# Patient Record
Sex: Female | Born: 1955 | Race: White | Hispanic: No | Marital: Married | Smoking: Never smoker
Health system: Southern US, Community
[De-identification: ages and names within clinical notes are randomized; demographics above are authoritative.]

## PROBLEM LIST (undated history)

## (undated) DIAGNOSIS — E119 Type 2 diabetes mellitus without complications: Secondary | ICD-10-CM

## (undated) DIAGNOSIS — H40053 Ocular hypertension, bilateral: Secondary | ICD-10-CM

## (undated) DIAGNOSIS — E7800 Pure hypercholesterolemia, unspecified: Secondary | ICD-10-CM

## (undated) DIAGNOSIS — E669 Obesity, unspecified: Secondary | ICD-10-CM

## (undated) DIAGNOSIS — N920 Excessive and frequent menstruation with regular cycle: Secondary | ICD-10-CM

## (undated) DIAGNOSIS — D649 Anemia, unspecified: Secondary | ICD-10-CM

## (undated) DIAGNOSIS — H43811 Vitreous degeneration, right eye: Secondary | ICD-10-CM

## (undated) HISTORY — DX: Pure hypercholesterolemia, unspecified: E78.00

## (undated) HISTORY — DX: Obesity, unspecified: E66.9

## (undated) HISTORY — DX: Vitreous degeneration, right eye: H43.811

## (undated) HISTORY — DX: Anemia, unspecified: D64.9

## (undated) HISTORY — PX: BREAST CYST ASPIRATION: SHX578

## (undated) HISTORY — DX: Excessive and frequent menstruation with regular cycle: N92.0

## (undated) HISTORY — DX: Ocular hypertension, bilateral: H40.053

## (undated) HISTORY — DX: Type 2 diabetes mellitus without complications: E11.9

## (undated) HISTORY — PX: DILATION AND CURETTAGE OF UTERUS: SHX78

---

## 1998-12-20 ENCOUNTER — Other Ambulatory Visit: Admission: RE | Admit: 1998-12-20 | Discharge: 1998-12-20 | Payer: Self-pay | Admitting: Obstetrics and Gynecology

## 1999-07-19 ENCOUNTER — Encounter: Payer: Self-pay | Admitting: Emergency Medicine

## 1999-07-19 ENCOUNTER — Encounter: Admission: RE | Admit: 1999-07-19 | Discharge: 1999-07-19 | Payer: Self-pay | Admitting: Emergency Medicine

## 2000-06-11 ENCOUNTER — Other Ambulatory Visit: Admission: RE | Admit: 2000-06-11 | Discharge: 2000-06-11 | Payer: Self-pay | Admitting: Obstetrics and Gynecology

## 2000-07-19 ENCOUNTER — Encounter: Payer: Self-pay | Admitting: Obstetrics and Gynecology

## 2000-07-19 ENCOUNTER — Encounter: Admission: RE | Admit: 2000-07-19 | Discharge: 2000-07-19 | Payer: Self-pay | Admitting: Obstetrics and Gynecology

## 2001-05-01 ENCOUNTER — Encounter: Payer: Self-pay | Admitting: Obstetrics and Gynecology

## 2001-05-01 ENCOUNTER — Encounter: Admission: RE | Admit: 2001-05-01 | Discharge: 2001-05-01 | Payer: Self-pay | Admitting: Obstetrics and Gynecology

## 2001-06-13 ENCOUNTER — Other Ambulatory Visit: Admission: RE | Admit: 2001-06-13 | Discharge: 2001-06-13 | Payer: Self-pay | Admitting: Obstetrics and Gynecology

## 2001-07-22 ENCOUNTER — Encounter: Admission: RE | Admit: 2001-07-22 | Discharge: 2001-07-22 | Payer: Self-pay | Admitting: Obstetrics and Gynecology

## 2001-07-22 ENCOUNTER — Encounter: Payer: Self-pay | Admitting: Obstetrics and Gynecology

## 2002-07-23 ENCOUNTER — Encounter: Admission: RE | Admit: 2002-07-23 | Discharge: 2002-07-23 | Payer: Self-pay | Admitting: Obstetrics and Gynecology

## 2002-07-23 ENCOUNTER — Encounter: Payer: Self-pay | Admitting: Obstetrics and Gynecology

## 2002-07-30 ENCOUNTER — Other Ambulatory Visit: Admission: RE | Admit: 2002-07-30 | Discharge: 2002-07-30 | Payer: Self-pay | Admitting: Obstetrics and Gynecology

## 2003-07-27 ENCOUNTER — Encounter: Admission: RE | Admit: 2003-07-27 | Discharge: 2003-07-27 | Payer: Self-pay | Admitting: Obstetrics and Gynecology

## 2003-08-10 ENCOUNTER — Other Ambulatory Visit: Admission: RE | Admit: 2003-08-10 | Discharge: 2003-08-10 | Payer: Self-pay | Admitting: Obstetrics and Gynecology

## 2003-12-21 ENCOUNTER — Ambulatory Visit (HOSPITAL_COMMUNITY): Admission: RE | Admit: 2003-12-21 | Discharge: 2003-12-21 | Payer: Self-pay | Admitting: Gastroenterology

## 2004-07-29 ENCOUNTER — Encounter: Admission: RE | Admit: 2004-07-29 | Discharge: 2004-07-29 | Payer: Self-pay | Admitting: Obstetrics and Gynecology

## 2004-08-11 ENCOUNTER — Other Ambulatory Visit: Admission: RE | Admit: 2004-08-11 | Discharge: 2004-08-11 | Payer: Self-pay | Admitting: Obstetrics and Gynecology

## 2005-08-11 DIAGNOSIS — E669 Obesity, unspecified: Secondary | ICD-10-CM

## 2005-08-11 HISTORY — DX: Obesity, unspecified: E66.9

## 2005-08-21 ENCOUNTER — Other Ambulatory Visit: Admission: RE | Admit: 2005-08-21 | Discharge: 2005-08-21 | Payer: Self-pay | Admitting: Obstetrics and Gynecology

## 2005-08-31 ENCOUNTER — Encounter: Admission: RE | Admit: 2005-08-31 | Discharge: 2005-08-31 | Payer: Self-pay | Admitting: Obstetrics and Gynecology

## 2005-09-22 ENCOUNTER — Encounter: Admission: RE | Admit: 2005-09-22 | Discharge: 2005-09-22 | Payer: Self-pay | Admitting: Obstetrics and Gynecology

## 2006-02-20 ENCOUNTER — Encounter: Admission: RE | Admit: 2006-02-20 | Discharge: 2006-02-20 | Payer: Self-pay | Admitting: Obstetrics and Gynecology

## 2006-08-23 ENCOUNTER — Other Ambulatory Visit: Admission: RE | Admit: 2006-08-23 | Discharge: 2006-08-23 | Payer: Self-pay | Admitting: Obstetrics & Gynecology

## 2006-09-03 ENCOUNTER — Encounter: Admission: RE | Admit: 2006-09-03 | Discharge: 2006-09-03 | Payer: Self-pay | Admitting: Obstetrics and Gynecology

## 2007-08-27 ENCOUNTER — Other Ambulatory Visit: Admission: RE | Admit: 2007-08-27 | Discharge: 2007-08-27 | Payer: Self-pay | Admitting: Obstetrics & Gynecology

## 2007-09-17 ENCOUNTER — Encounter: Admission: RE | Admit: 2007-09-17 | Discharge: 2007-09-17 | Payer: Self-pay | Admitting: Obstetrics & Gynecology

## 2008-01-13 ENCOUNTER — Encounter: Admission: RE | Admit: 2008-01-13 | Discharge: 2008-01-13 | Payer: Self-pay | Admitting: Emergency Medicine

## 2008-08-31 ENCOUNTER — Other Ambulatory Visit: Admission: RE | Admit: 2008-08-31 | Discharge: 2008-08-31 | Payer: Self-pay | Admitting: Obstetrics & Gynecology

## 2008-09-22 ENCOUNTER — Encounter: Admission: RE | Admit: 2008-09-22 | Discharge: 2008-09-22 | Payer: Self-pay | Admitting: Obstetrics & Gynecology

## 2008-12-22 ENCOUNTER — Ambulatory Visit (HOSPITAL_COMMUNITY): Admission: RE | Admit: 2008-12-22 | Discharge: 2008-12-23 | Payer: Self-pay | Admitting: Obstetrics & Gynecology

## 2008-12-22 ENCOUNTER — Encounter: Payer: Self-pay | Admitting: Obstetrics & Gynecology

## 2008-12-22 HISTORY — PX: ABDOMINAL HYSTERECTOMY: SHX81

## 2009-09-24 ENCOUNTER — Encounter: Admission: RE | Admit: 2009-09-24 | Discharge: 2009-09-24 | Payer: Self-pay | Admitting: Obstetrics & Gynecology

## 2009-12-30 ENCOUNTER — Encounter: Admission: RE | Admit: 2009-12-30 | Discharge: 2009-12-30 | Payer: Self-pay | Admitting: Family Medicine

## 2010-02-09 ENCOUNTER — Ambulatory Visit (HOSPITAL_COMMUNITY): Admission: RE | Admit: 2010-02-09 | Discharge: 2010-02-09 | Payer: Self-pay | Admitting: Surgery

## 2010-02-09 HISTORY — PX: OTHER SURGICAL HISTORY: SHX169

## 2010-10-02 ENCOUNTER — Encounter: Payer: Self-pay | Admitting: Obstetrics and Gynecology

## 2010-10-11 ENCOUNTER — Other Ambulatory Visit: Payer: Self-pay | Admitting: Obstetrics & Gynecology

## 2010-10-11 DIAGNOSIS — Z1239 Encounter for other screening for malignant neoplasm of breast: Secondary | ICD-10-CM

## 2010-11-28 LAB — DIFFERENTIAL
Basophils Relative: 0 % (ref 0–1)
Eosinophils Absolute: 0.1 10*3/uL (ref 0.0–0.7)
Monocytes Relative: 9 % (ref 3–12)
Neutrophils Relative %: 52 % (ref 43–77)

## 2010-11-28 LAB — COMPREHENSIVE METABOLIC PANEL
ALT: 22 U/L (ref 0–35)
Alkaline Phosphatase: 86 U/L (ref 39–117)
CO2: 29 mEq/L (ref 19–32)
Chloride: 105 mEq/L (ref 96–112)
GFR calc non Af Amer: >60 mL/min (ref >60)
Glucose, Bld: 95 mg/dL (ref 70–99)
Potassium: 3.8 mEq/L (ref 3.5–5.1)
Sodium: 142 mEq/L (ref 135–145)
Total Bilirubin: 0.5 mg/dL (ref 0.3–1.2)
Total Protein: 7.1 g/dL (ref 6.0–8.3)

## 2010-11-28 LAB — CBC
Hemoglobin: 13 g/dL (ref 12.0–15.0)
RBC: 4.28 MIL/uL (ref 3.87–5.11)
WBC: 5.9 10*3/uL (ref 4.0–10.5)

## 2010-12-05 ENCOUNTER — Ambulatory Visit
Admission: RE | Admit: 2010-12-05 | Discharge: 2010-12-05 | Disposition: A | Discharge status: Home / Self Care | Payer: BC Managed Care – PPO | Source: Ambulatory Visit | Attending: Obstetrics & Gynecology | Admitting: Obstetrics & Gynecology

## 2010-12-05 DIAGNOSIS — Z1239 Encounter for other screening for malignant neoplasm of breast: Secondary | ICD-10-CM

## 2010-12-21 LAB — BASIC METABOLIC PANEL
CO2: 29 mEq/L (ref 19–32)
Calcium: 9.3 mg/dL (ref 8.4–10.5)
GFR calc Af Amer: >60 mL/min (ref >60)
GFR calc non Af Amer: >60 mL/min (ref >60)
Glucose, Bld: 115 mg/dL — ABNORMAL HIGH (ref 70–99)
Glucose, Bld: 136 mg/dL — ABNORMAL HIGH (ref 70–99)
Potassium: 3.7 mEq/L (ref 3.5–5.1)
Sodium: 139 mEq/L (ref 135–145)
Sodium: 140 mEq/L (ref 135–145)

## 2010-12-21 LAB — CBC
HCT: 32 % — ABNORMAL LOW (ref 36.0–46.0)
Hemoglobin: 10.9 g/dL — ABNORMAL LOW (ref 12.0–15.0)
Hemoglobin: 12.7 g/dL (ref 12.0–15.0)
MCHC: 34 g/dL (ref 30.0–36.0)
Platelets: 336 10*3/uL (ref 150–400)
RBC: 3.71 MIL/uL — ABNORMAL LOW (ref 3.87–5.11)
RDW: 14.7 % (ref 11.5–15.5)
RDW: 14.8 % (ref 11.5–15.5)

## 2010-12-21 LAB — URINALYSIS, ROUTINE W REFLEX MICROSCOPIC
Bilirubin Urine: NEGATIVE
Glucose, UA: NEGATIVE mg/dL
Hgb urine dipstick: NEGATIVE
Specific Gravity, Urine: 1.025 (ref 1.005–1.030)
pH: 5 (ref 5.0–8.0)

## 2010-12-21 LAB — PROTIME-INR: INR: 1 (ref 0.00–1.49)

## 2010-12-21 LAB — TYPE AND SCREEN

## 2010-12-21 LAB — APTT: aPTT: 29 seconds (ref 24–37)

## 2011-01-24 NOTE — Op Note (Signed)
Carolyn Rollins, Carolyn Rollins             ACCOUNT NO.:  [id number]   MEDICAL RECORD NO.:  [id number]          PATIENT TYPE:  OIB   LOCATION:  9320                          FACILITY:  [facility]   PHYSICIAN:  M. Leda Quail, MD  DATE OF BIRTH:  January 06, 1956   DATE OF PROCEDURE:  12/22/2008  DATE OF DISCHARGE:                               OPERATIVE REPORT   PREOPERATIVE DIAGNOSES:  A 55 year old G3, P2, A1, married white female  with menorrhagia, pelvic pain, and fibroid uterus.   POSTOPERATIVE DIAGNOSES:  A 55 year old G3, P2, A1, married white female  with menorrhagia, pelvic pain, and fibroid uterus.   PROCEDURE:  Laparoscopic-assisted vaginal hysterectomy and bilateral  salpingo-oophorectomy.   SURGEON:  M. Leda Quail, MD   ASSISTANT:  Edwena Felty. Romine, MD   ANESTHESIA:  General endotracheal, Raul Del, MD, oversaw the  procedure.   FINDINGS:  Fibroid uterus with normal-appearing ovaries and tubes.   SPECIMENS:  Uterus, cervix, tubes, and ovaries all sent to Pathology.   ESTIMATED BLOOD LOSS:  100 mL.   URINE OUTPUT:  600 mL of clear urine through Foley catheter.   FLUIDS:  2300 mL of LR.   COMPLICATIONS:  None.   INDICATIONS:  Ms. Knoop is a 55 year old G3, P2, A1 married white  female with a history of a fibroid uterus with menorrhagia and pelvic  pain that has worsened over the last 3 years.  She has been evaluated by  Dr. Loreta Ave in GI for possible gastroenterologic causes of pain and her  evaluation has been essentially normal.  Her pain is significantly worse  right before and during menstrual cycles.  We talked about possible  diagnosis of endometriosis.  Her ultrasound does have some bodies  consistent with adenomyosis, but not 100% definitive, as the patient  cycles continued to worsen or since she has decided at this point to  proceed with definitive management.  Risks and benefits have been  discussed with her.  These are all documented in her office  chart.   PROCEDURE:  The patient was taken to the operating room.  She had a  running IV in place.  Informed consent was present on the chart.  She  was placed in supine position initially.  General endotracheal  anesthesia was administered by the anesthesia staff without difficulty.  Legs were positioned in the low-lithotomy position in the [place]  stirrups.  Some slight bend in her knee is present.  SCDs were present  on the lower extremities bilaterally.  Abdomen, perineum, inner thighs,  and vagina were prepped in normal sterile fashion with Betadine.  A  bivalve speculum was placed in the vagina.  The anterior lip of the  cervix was grasped with a single-tooth tenaculum.  A Hulka clamped was  passed through the cervical canal attached to the cervix as a means to  manipulate the uterus for the procedure.  The tenaculum was removed and  the Hulka clamp was left in place.  The speculum was removed from the  vagina.  Foley catheter was inserted under sterile conditions in the  bladder.  The patient was  then draped in normal sterile fashion.  Legs  were left in the low-lithotomy position.  Attention was turned to the  abdomen.  0.25% Marcaine was placed beneath the umbilicus.  Using a #11  blade, a  10-mm skin incision was made.  The subcu fat tissue was  dissected.  The Veress needle was obtained.  The abdomen was elevated  and the Veress needle was aimed toward the pelvis.  Once the peritoneum  was popped through, a syringe of normal saline was obtained.  This was  attached to the Veress needle.  Aspiration was initially performed  without any blood or fluid being noted.  Fluid was injected into the  Veress needle.  Then, an aspiration was performed again.  No fluid,  blood, or saline was noted.  Saline dripped easily into the Veress  needle.  CO2 gas was then attached to the Veress needle and  pneumoperitoneum was achieved under low flows.  Once 3 liters of CO2 gas  was in the abdomen,  the Veress needle was removed.  A 10-mm OptiVu  trocar and port were obtained and attached to the laparoscope.  The  abdominal wall was elevated, and using a twisting motion in the trocar  and port, the laparoscope was placed into the abdomen.  Intraperitoneal  placement was noted and confirmed.  The patient was placed in semi-  Trendelenburg positioning.  The pelvis and upper abdomen were surveyed.  Photodocumentation was made.  Ovaries appeared normal.  Uterus appeared  normal other than being globular and consistent with fibroids.  The  upper abdomen appears normal with a normal liver edge and stomach edge.  There was no evidence of endometriosis or adhesions.  Ureter peristalsis  was noted bilaterally.  The abdominal wall layers transilluminated and  port site locations chosen in the right and left lower quadrant.  0.25%  Marcaine was used to anesthetize the skin.  A #11 blade was used to make  a skin incision.  Then, the 5-mm nonbladed trocar and ports were placed  under direct visualization in the right and left lower quadrants.  Blunt  probe was used to help manipulate the ovaries.  They are freely mobile.  An EnSeal device was obtained.  Starting with the right IP ligament, the  IP ligament was serially clamped, cauterized, and transected using the  EnSeal device.  This continued down the mesosalpinx into the right round  ligament which was serially clamped, cauterized, and incised.  Then, the  inferior portion of the broad ligament was serially clamped, cauterized,  and incised.  Then, in similar fashion on the left side starting with  the IP ligament was serially clamped, cauterized, and incised.  The left  round ligament serially clamped, cauterized, and incised and the  inferior portion of the broad ligament was serially clamped, cauterized,  and incised.  Excellent hemostasis was present at this point.  All  pedicles were well above the level of the ureters.  At this point,   decision was made to move to the vaginal portion of the procedure.  All  instruments were removed from the vagina.  The pneumoperitoneum was  released.  The patient was taken out of Trendelenburg.  Legs were lifted  to the high-lithotomy position.  The attention was turned to vagina.  Heavy-weighted speculum was placed in the vagina.  The patient was then  placed in a tiny bit of Trendelenburg.  Cervix was grasped with a  Jacobson tenaculum.  1% lidocaine mixed 1:1  with epinephrine (1:100,000  units) was instilled around the cervix.  Posterior cul-de-sac was  entered sharply.  The posterior vaginal mucosa and peritoneum were  attached using figure-of-eight suture of #0 Vicryl.  Then, a Bonanno  speculum was placed in this incision.  The uterosacral ligaments on each  side are clamped, incised, and suture ligated with Heaney stitches of #0  Vicryl.  Then, the anterior aspect of the cervix was incised with a  blade.  The pubovesicocervical fascia was then dissected sharply with  scissors down to the level of the cervix.  Then using blunt dissection,  the plane between the bladder and the cervix was identified and the  bladder was dissected off of the uterus.  The reflection of the  peritoneum was noted as quite high.  Decision was made to take some  bites of the cardinal ligament at this point.  Cardinal ligaments were  then serially clamped, cut, and suture ligated with #0 Vicryl.  This was  done twice on each side.  At this point, the reflection of the  peritoneum anteriorly could be very easily visualized.  This was  elevated and incised sharply.  It was opened sharply.  A Deaver  retractor was placed through this incision.  Bowel was noted above the  fundus of the uterus to ensure correct plane location.  Then, the  remaining portion of the cardinal ligaments were serially clamped,  transected, and suture ligated with #0 Vicryl.  Then, the uterine artery  on each side was identified,  clamped, and incised.  The uterine artery  pedicle was doubly suture ligated with #0 Vicryl x2 on each side.  Then,  1 additional clamp of the broad ligament on each side was obtained.  This was transected and suture ligated with #0 Vicryl.  At this point,  there was minimal tissue that was left.  The uterus could be delivered  through the incision.  A curved Heaney clamp was placed across this  small remaining pedicles of tissue.  These tissue pedicles were then  tied with free ties of #0 Vicryl.  At this point, the uterus, cervix,  tubes, and ovaries have been delivered from the vagina.  The operative  weight was 115 grams.  The pedicles were hemostatic, therefore the  anterior peritoneum and vaginal mucosa were connected with the running  interlocking fashion.  This was using #0 Vicryl.  The cuff was run  completely by starting at the 2 o'clock position incorporating the  uterosacral ligaments and posterior vaginal mucosa and posterior  peritoneum were appropriate.  Running the cuff ended at 10 o'clock and  the suture was tied.  Then, 3 figure-of-eight sutures of #0 Vicryl used  to close the cuff completely.  The vaginal tissue was irrigated.  No  bleeding was noted.  Legs positioned back in the low-lithotomy position  after all instruments were removed from the vagina.  Sterile gloves were  changed.  Attention was turned to the abdomen.  Pneumoperitoneum was  reachieved under low flow.  The laparoscope was used to visualize the  pelvis.  The patient was placed in semi-Trendelenburg.  A Nezhat suction  irrigator was obtained.  Pedicles were irrigated.  There was a tiny bit  of bleeding on the edge of the left cuff which was grasped with an  EnSeal device and made hemostatic.  Ureter peristalsis was noted  bilaterally again.  At this point, excellent hemostasis was noted and  the procedure was ended.  The pneumoperitoneum  was relieved.  The  patient was placed back and was taken off  Trendelenburg positioning.  The 2 inferior ports were removed under direct visualization of  laparoscope.  Then, before the midline port was completely removed, the  patient was given several deep breaths by the CRNA to try and get as  much gas off the abdomen as possible.  The midline port was then  removed.  The fascia was closed at the midline using a figure-of-eight  suture of #2-0 Vicryl.  The skin at the umbilicus was closed using a  subcuticular stitch of 3-0 Vicryl.  Skin was cleansed and dried.  Dermabond was used to close the incisions inferiorly and make the  midline incision air tight.   Sponge, lap, needle and instrument counts were correct x2.  The patient  tolerated procedure very well.  The patient was awakened from anesthesia  and extubated without difficulty.  The Betadine scrub was washed out of  the patient's skin.  The legs were positioned back in the supine  position.  She was then taken to the recovery room in stable condition.      Lum Keas, MD  Electronically Signed     MSM/MEDQ  D:  12/22/2008  T:  12/23/2008  Job:  [id number]   cc:   Anselmo Rod, M.D.  Fax: [phone]

## 2011-01-24 NOTE — Discharge Summary (Signed)
Carolyn Rollins, Carolyn Rollins             ACCOUNT NO.:  [id number]   MEDICAL RECORD NO.:  [id number]          PATIENT TYPE:  OIB   LOCATION:  9320                          FACILITY:  [facility]   PHYSICIAN:  M. Leda Quail, MD  DATE OF BIRTH:  16-Aug-1956   DATE OF ADMISSION:  12/22/2008  DATE OF DISCHARGE:                               DISCHARGE SUMMARY   ADMISSION DIAGNOSES:  7. A 55 year old G3, P2, A1, married white female with menorrhagia.  2. Pelvic pain.  3. Fibroids.   DISCHARGE DIAGNOSES:  4. A 55 year old G3, P2, A1, married white female with menorrhagia.  2. Pelvic pain.  3. Fibroids.   PROCEDURE:  laparoscopic-assisted vaginal hysterectomy and bilateral  salpingo-oophorectomy.   HISTORY OF PRESENT ILLNESS:  Written in the chart.   HOSPITAL COURSE:  The patient was admitted to Same-Day Surgery.  She was  taken to operating room where an LAVH-BSO was performed out difficulty.  She had 100 mL blood loss.  The patient was stable during the surgery.  After the surgery was over, she was transferred to the recovery room and  from there to the third floor for the remainder of her hospitalization.  She used more Dilaudid low-dose PCA for pain management and had a Foley  overnight.  She was seen in the evening of postoperative day zero, was  doing very well, was tolerating liquids, was able to stand and  ambulating about her room without difficulty.  She had excellent bowel  sounds and good pain management.  By postoperative day #1, she had  passed flatus.  She had no nausea and good pain control.  Vital signs  were stable.  She was afebrile.  She made 1450 mL of urine output.  Postoperative labs showed a hemoglobin of 10.9, white count of 12.2,  platelet count of 290.  BUN and creatinine were 6 and 0.6.  Sodium and  potassium were 139 and 3.7 respectively.  Exam was completely benign.  Her abdomen was soft, nontender, nondistended with good bowel sounds.  Incisions were clean, dry,  and intact.  She had minimal spotting  vaginally.  At this point, she had been able to ambulate.  Her Foley  already had been removed and has been able to void.  She had tolerate  regular foods.  Her IV and PCA will be discontinued this morning and she  will be transitioned to oral pain medicines.  The patient is well and  she will be discharged to home with her family.  The patient will see me  next week for followup on hopefully Wednesday.  This appointment will be  made later today.  She has prescriptions for Percocet and Motrin to be  taken postoperatively for pain.  Directions have been provided in the  verbal and written form for her.  She is also going to start Vivelle-Dot  patches and her husband will come by the office in the morning to pick  up the samples of this.     Lum Keas, MD  Electronically Signed    MSM/MEDQ  D:  12/23/2008  T:  12/23/2008  Job:  [id number]

## 2011-01-27 NOTE — Op Note (Signed)
NAME:  Carolyn Rollins, Carolyn Rollins                       ACCOUNT NO.:  [id number]   MEDICAL RECORD NO.:  [id number]                   PATIENT TYPE:  AMB   LOCATION:  ENDO                                 FACILITY:  [facility]   PHYSICIAN:  John C. Madilyn Fireman, M.D.                 DATE OF BIRTH:  1955/11/20   DATE OF PROCEDURE:  12/21/2003  DATE OF DISCHARGE:                                 OPERATIVE REPORT   PROCEDURE:  Colonoscopy.   INDICATION FOR PROCEDURE:  Family history of colon cancer in a second degree  relative in a patient with no recent colon screening.   PROCEDURE:  The patient was placed in the left lateral decubitus position  and placed on the pulse monitor with continuous low-flow oxygen delivered by  nasal cannula.  She was sedated with 75 mcg IV fentanyl and 8 mg IV Versed.  The Olympus video colonoscope was inserted into the rectum and advanced to  the cecum, confirmed by transillumination of McBurney's point and  visualization of ileocecal valve and appendiceal orifice.  The prep was  excellent.  The cecum, ascending, transverse, descending and sigmoid colon  all appeared normal.  No masses, polyps, diverticula or other mucosal  abnormality.  The rectum likewise appeared normal.  On retroflexed view, the  anus revealed only small internal hemorrhoids.  The scope was then withdrawn  and the patient returned to the recovery room in stable condition.  She  tolerated the procedure well and there were no immediate complications.   IMPRESSION:  Internal hemorrhoids, otherwise normal study.   PLAN:  Will probably proceed with next colon screening by colonoscopy in  approximately __________ time.                                               John C. Madilyn Fireman, M.D.    JCH/MEDQ  D:  12/21/2003  T:  12/21/2003  Job:  [id number]   cc:   Oley Balm. Georgina Pillion, M.D.  [address]  [place]  [place] [address]  Fax: [phone]

## 2011-09-12 DIAGNOSIS — E119 Type 2 diabetes mellitus without complications: Secondary | ICD-10-CM

## 2011-09-12 HISTORY — DX: Type 2 diabetes mellitus without complications: E11.9

## 2011-10-19 ENCOUNTER — Other Ambulatory Visit: Payer: Self-pay | Admitting: Obstetrics & Gynecology

## 2011-10-19 DIAGNOSIS — Z1231 Encounter for screening mammogram for malignant neoplasm of breast: Secondary | ICD-10-CM

## 2011-12-06 ENCOUNTER — Ambulatory Visit
Admission: RE | Admit: 2011-12-06 | Discharge: 2011-12-06 | Disposition: A | Discharge status: Home / Self Care | Payer: BC Managed Care – PPO | Source: Ambulatory Visit | Attending: Obstetrics & Gynecology | Admitting: Obstetrics & Gynecology

## 2011-12-06 DIAGNOSIS — Z1231 Encounter for screening mammogram for malignant neoplasm of breast: Secondary | ICD-10-CM

## 2012-07-10 ENCOUNTER — Other Ambulatory Visit: Payer: Self-pay | Admitting: Obstetrics & Gynecology

## 2012-07-10 DIAGNOSIS — Z780 Asymptomatic menopausal state: Secondary | ICD-10-CM

## 2012-07-12 ENCOUNTER — Ambulatory Visit
Admission: RE | Admit: 2012-07-12 | Discharge: 2012-07-12 | Disposition: A | Discharge status: Home / Self Care | Payer: BC Managed Care – PPO | Source: Ambulatory Visit | Attending: Obstetrics & Gynecology | Admitting: Obstetrics & Gynecology

## 2012-07-12 DIAGNOSIS — Z780 Asymptomatic menopausal state: Secondary | ICD-10-CM

## 2012-09-23 ENCOUNTER — Other Ambulatory Visit: Payer: Self-pay | Admitting: Obstetrics & Gynecology

## 2012-09-23 DIAGNOSIS — Z1231 Encounter for screening mammogram for malignant neoplasm of breast: Secondary | ICD-10-CM

## 2013-01-02 ENCOUNTER — Ambulatory Visit
Admission: RE | Admit: 2013-01-02 | Discharge: 2013-01-02 | Disposition: A | Discharge status: Home / Self Care | Payer: BC Managed Care – PPO | Source: Ambulatory Visit | Attending: Obstetrics & Gynecology | Admitting: Obstetrics & Gynecology

## 2013-01-02 DIAGNOSIS — Z1231 Encounter for screening mammogram for malignant neoplasm of breast: Secondary | ICD-10-CM

## 2013-06-03 ENCOUNTER — Encounter: Payer: Self-pay | Admitting: Obstetrics & Gynecology

## 2013-08-20 ENCOUNTER — Encounter: Payer: Self-pay | Admitting: Obstetrics & Gynecology

## 2013-08-21 ENCOUNTER — Ambulatory Visit (INDEPENDENT_AMBULATORY_CARE_PROVIDER_SITE_OTHER): Payer: No Typology Code available for payment source | Admitting: Obstetrics & Gynecology

## 2013-08-21 ENCOUNTER — Encounter: Payer: Self-pay | Admitting: Obstetrics & Gynecology

## 2013-08-21 VITALS — BP 120/82 | HR 60 | Resp 16 | Ht 64.0 in | Wt 190.8 lb

## 2013-08-21 DIAGNOSIS — Z01419 Encounter for gynecological examination (general) (routine) without abnormal findings: Secondary | ICD-10-CM

## 2013-08-21 DIAGNOSIS — Z0000 Encounter for general adult medical examination without abnormal findings: Secondary | ICD-10-CM

## 2013-08-21 LAB — POCT URINALYSIS DIPSTICK
Blood, UA: NEGATIVE
Glucose, UA: NEGATIVE
Ketones, UA: NEGATIVE
Urobilinogen, UA: NEGATIVE

## 2013-08-21 LAB — HEMOGLOBIN, FINGERSTICK: Hemoglobin, fingerstick: 12.9 g/dL (ref 12.0–16.0)

## 2013-08-21 MED ORDER — ESTRADIOL 0.1 MG/24HR TD PTTW: 1.0000 | MEDICATED_PATCH | TRANSDERMAL | Status: DC

## 2013-08-21 NOTE — Patient Instructions (Signed)

## 2013-08-21 NOTE — Progress Notes (Signed)
57 y.o. G3P2 MarriedCaucasianF here for annual exam.  Daughter getting married Saturday.  This is, of course, a very busy week for her.  Sisters are coming in to help today.  Son is coming in from [place].  He is working on his dissertation for his PhD in [employer].    She does still have some hot flashes and she will occasionally have some vaginal dryness.    Patient's last menstrual period was 09/11/2008.          Sexually active: yes  The current method of family planning is status post hysterectomy.    Exercising: yes  walking some Smoker:  no  Health Maintenance: Pap:  08/31/08 WNL History of abnormal Pap:  no MMG:  01/02/13 3D normal Colonoscopy:  11/13 Dr Marylee Floras in 5 years BMD:   11/13, normal TDaP:  2007 Screening Labs: Dr. Kevan Ny, Hb today: 12.9, Urine today: WBC-trace, PROTEIN-trace   reports that she has never smoked. She has never used smokeless tobacco. She reports that she drinks about 0.5 ounces of alcohol per week. She reports that she does not use illicit drugs.  Past Medical History  Diagnosis Date  . Elevated LDL cholesterol level   . Menorrhagia   . Obesity 12/06  . Diabetes 2013    AODM- Started on Metformin    Past Surgical History  Procedure Laterality Date  . Abdominal hysterectomy  12/22/08    LAVH/BSO  . Dilation and curettage of uterus    . Lipoma removal  02/2010    Current Outpatient Prescriptions  Medication Sig Dispense Refill  . estradiol (VIVELLE-DOT) 0.1 MG/24HR patch Place 1 patch onto the skin 2 (two) times a week.      . latanoprost (XALATAN) 0.005 % ophthalmic solution       . metFORMIN (GLUCOPHAGE-XR) 500 MG 24 hr tablet Take 500 mg by mouth daily with breakfast.      . ONE TOUCH ULTRA TEST test strip       . Vitamin D, Ergocalciferol, (DRISDOL) 50000 UNITS CAPS capsule Take 50,000 Units by mouth every 7 (seven) days.       . fluticasone (FLONASE) 50 MCG/ACT nasal spray        No current facility-administered medications for this  visit.    Family History  Problem Relation Age of Onset  . Asthma Mother   . Emphysema Father   . Cancer Sister     bladder cancer  . Emphysema Sister   . Cancer Brother     lung cancer    ROS:  Pertinent items are noted in HPI.  Otherwise, a comprehensive ROS was negative.  Exam:   BP 120/82  Pulse 60  Resp 16  Ht 5\' 4"  (1.626 m)  Wt 190 lb 12.8 oz (86.546 kg)  BMI 32.73 kg/m2  LMP 09/11/2008  Weight change: -14lbs   Height: 5\' 4"  (162.6 cm)  Ht Readings from Last 3 Encounters:  08/21/13 5\' 4"  (1.626 m)    General appearance: alert, cooperative and appears stated age Head: Normocephalic, without obvious abnormality, atraumatic Neck: no adenopathy, supple, symmetrical, trachea midline and thyroid normal to inspection and palpation Lungs: clear to auscultation bilaterally Breasts: normal appearance, no masses or tenderness Heart: regular rate and rhythm Abdomen: soft, non-tender; bowel sounds normal; no masses,  no organomegaly Extremities: extremities normal, atraumatic, no cyanosis or edema Skin: Skin color, texture, turgor normal. No rashes or lesions Lymph nodes: Cervical, supraclavicular, and axillary nodes normal. No abnormal inguinal nodes palpated Neurologic: Grossly  normal   Pelvic: External genitalia:  no lesions              Urethra:  normal appearing urethra with no masses, tenderness or lesions              Bartholins and Skenes: normal                 Vagina: normal appearing vagina with normal color and discharge, no lesions              Cervix: absent              Pap taken: no Bimanual Exam:  Uterus:  normal size, contour, position, consistency, mobility, non-tender              Adnexa: normal adnexa and no mass, fullness, tenderness               Rectovaginal: Confirms               Anus:  normal sphincter tone, no lesions  A:  Well Woman with normal exam H/O LAVH/BSO4/10, on HRT Low Vitamin D IBS  P:   Mammogram yearly.  We discussed 3D MMG  and her breast density. pap smear not indicated Vit D today.  On Vit D 50,000 IU weekly.  Will need new RX Change to Minivelle dot 0.1mg  patches weekly.  Changed due to cost. return annually or prn  An After Visit Summary was printed and given to the patient.

## 2013-08-22 LAB — VITAMIN D 25 HYDROXY (VIT D DEFICIENCY, FRACTURES): Vit D, 25-Hydroxy: 42 ng/mL (ref 30–89)

## 2013-08-22 MED ORDER — VITAMIN D (ERGOCALCIFEROL) 1.25 MG (50000 UNIT) PO CAPS: 50000.0000 [IU] | ORAL_CAPSULE | ORAL | Status: DC

## 2013-08-22 NOTE — Addendum Note (Signed)
Addended by: Jerene Bears on: 08/22/2013 05:24 AM   Modules accepted: Orders

## 2013-09-30 ENCOUNTER — Telehealth: Payer: Self-pay | Admitting: Obstetrics & Gynecology

## 2013-09-30 NOTE — Telephone Encounter (Signed)
Message left to return call to Carolyn Rollins at [phone].    

## 2013-09-30 NOTE — Telephone Encounter (Signed)
Pt states she has a question for the nurse regarding her medication.

## 2013-10-22 ENCOUNTER — Other Ambulatory Visit: Payer: Self-pay | Admitting: Obstetrics & Gynecology

## 2013-10-22 MED ORDER — ESTRADIOL 1 MG PO TABS: 1.0000 mg | ORAL_TABLET | Freq: Every day | ORAL | Status: DC

## 2013-10-22 NOTE — Progress Notes (Signed)
Patient needs new HRT recommendation due to change in coverage.  For now, will change from minivelle 0.1mg  to oral estradiol 1mg  daily.  Rx to pharmacy.  Left message for patient.  If you have earlier phone encounter open, ok to close.

## 2013-11-27 ENCOUNTER — Ambulatory Visit: Payer: Self-pay | Admitting: Obstetrics & Gynecology

## 2013-11-27 ENCOUNTER — Ambulatory Visit: Payer: No Typology Code available for payment source | Admitting: Obstetrics & Gynecology

## 2013-12-09 ENCOUNTER — Other Ambulatory Visit: Payer: Self-pay

## 2013-12-09 DIAGNOSIS — Z1231 Encounter for screening mammogram for malignant neoplasm of breast: Secondary | ICD-10-CM

## 2014-01-01 ENCOUNTER — Other Ambulatory Visit: Payer: Self-pay | Admitting: Dermatology

## 2014-01-05 ENCOUNTER — Ambulatory Visit
Admission: RE | Admit: 2014-01-05 | Discharge: 2014-01-05 | Disposition: A | Discharge status: Home / Self Care | Payer: No Typology Code available for payment source | Source: Ambulatory Visit

## 2014-01-05 ENCOUNTER — Encounter (INDEPENDENT_AMBULATORY_CARE_PROVIDER_SITE_OTHER): Payer: Self-pay

## 2014-01-05 DIAGNOSIS — Z1231 Encounter for screening mammogram for malignant neoplasm of breast: Secondary | ICD-10-CM

## 2014-05-08 ENCOUNTER — Other Ambulatory Visit: Payer: Self-pay | Admitting: Dermatology

## 2014-07-13 ENCOUNTER — Encounter: Payer: Self-pay | Admitting: Obstetrics & Gynecology

## 2014-08-29 ENCOUNTER — Other Ambulatory Visit: Payer: Self-pay | Admitting: Obstetrics & Gynecology

## 2014-08-31 NOTE — Telephone Encounter (Signed)
Medication refill request: Vitamin D 50,000 units Last AEX:  08/21/13 Next AEX: 09/15/14 Last Vit D: 08/21/13, 42 Last MMG (if hormonal medication request): NA Refill authorized: #4 X 3

## 2014-09-15 ENCOUNTER — Encounter: Payer: Self-pay | Admitting: Obstetrics & Gynecology

## 2014-09-15 ENCOUNTER — Ambulatory Visit (INDEPENDENT_AMBULATORY_CARE_PROVIDER_SITE_OTHER): Payer: No Typology Code available for payment source | Admitting: Obstetrics & Gynecology

## 2014-09-15 VITALS — BP 130/74 | HR 68 | Resp 16 | Ht 63.5 in | Wt 206.8 lb

## 2014-09-15 DIAGNOSIS — E119 Type 2 diabetes mellitus without complications: Secondary | ICD-10-CM | POA: Insufficient documentation

## 2014-09-15 DIAGNOSIS — Z01419 Encounter for gynecological examination (general) (routine) without abnormal findings: Secondary | ICD-10-CM

## 2014-09-15 DIAGNOSIS — Z0000 Encounter for general adult medical examination without abnormal findings: Secondary | ICD-10-CM

## 2014-09-15 LAB — POCT URINALYSIS DIPSTICK
Bilirubin, UA: NEGATIVE
GLUCOSE UA: NEGATIVE
KETONES UA: NEGATIVE
Leukocytes, UA: NEGATIVE
Nitrite, UA: NEGATIVE
Protein, UA: NEGATIVE
RBC UA: NEGATIVE
Urobilinogen, UA: NEGATIVE
pH, UA: 5

## 2014-09-15 MED ORDER — VITAMIN D (ERGOCALCIFEROL) 1.25 MG (50000 UNIT) PO CAPS: ORAL_CAPSULE | ORAL | Status: DC

## 2014-09-15 MED ORDER — METRONIDAZOLE 0.75 % VA GEL: 1.0000 | Freq: Every day | VAGINAL | Status: DC

## 2014-09-15 MED ORDER — ESTRADIOL 1 MG PO TABS: 1.0000 mg | ORAL_TABLET | Freq: Every day | ORAL | Status: DC

## 2014-09-15 NOTE — Progress Notes (Signed)
59 y.o. G3P2 MarriedCaucasianF here for annual exam.  Pt reports she is doing well.  No vaginal bleeding.  Pt's major medical issue this year was a vitreus detachment.  Pt had flashes of light and black spots that are in her right eye only.  Pt reports she still "sees" some flashes of lights in her eyes.  Did see her ophthalmologist.  Evaluation was essentially negative.  Pt also has diagnosis of "pre-glaucoma".    Reports having a "fishy smell".  Pt denied vaginal discharge.  Would like this evaluated.    PCP:  Dr. Inda Merlin.  HbA1C 6.1.    Patient's last menstrual period was 09/11/2008.          Sexually active: Yes.    The current method of family planning is status post hysterectomy.    Exercising: Yes.    walking Smoker:  no  Health Maintenance: Pap:  08/31/08 WNL History of abnormal Pap:  no MMG:  01/05/14 3D-normal Colonoscopy:  11/13-repeat in 2018 with Dr Collene Mares BMD:   11/13-normal-repeat in 5 years TDaP:  2007 Screening Labs: Dr Inda Merlin, Hb today: Dr Inda Merlin, Urine today: negative   reports that she has never smoked. She has never used smokeless tobacco. She reports that she drinks about 0.5 - 1.0 oz of alcohol per week. She reports that she does not use illicit drugs.  Past Medical History  Diagnosis Date  . Elevated LDL cholesterol level   . Menorrhagia   . Obesity 12/06  . Diabetes 2013  . Vitreous detachment of right eye     Past Surgical History  Procedure Laterality Date  . Abdominal hysterectomy  12/22/08    LAVH/BSO  . Dilation and curettage of uterus    . Lipoma removal  02/2010    Current Outpatient Prescriptions  Medication Sig Dispense Refill  . cholecalciferol (VITAMIN D) 1000 UNITS tablet Take 1,000 Units by mouth daily.    Marland Kitchen estradiol (ESTRACE) 1 MG tablet Take 1 tablet (1 mg total) by mouth daily. 30 tablet 12  . latanoprost (XALATAN) 0.005 % ophthalmic solution     . metFORMIN (GLUCOPHAGE-XR) 500 MG 24 hr tablet Take 500 mg by mouth daily with breakfast.     . ONE TOUCH ULTRA TEST test strip     . Vitamin D, Ergocalciferol, (DRISDOL) 50000 UNITS CAPS capsule Take one capsule by mouth every 7 days 4 capsule 0   No current facility-administered medications for this visit.    Family History  Problem Relation Age of Onset  . Asthma Mother   . Emphysema Father   . Cancer Sister     bladder cancer  . Emphysema Sister   . Cancer Brother     lung cancer    ROS:  Pertinent items are noted in HPI.  Otherwise, a comprehensive ROS was negative.  Exam:   LMP 09/11/2008      Ht Readings from Last 3 Encounters:  08/21/13 5\' 4"  (1.626 m)    General appearance: alert, cooperative and appears stated age Head: Normocephalic, without obvious abnormality, atraumatic Neck: no adenopathy, supple, symmetrical, trachea midline and thyroid normal to inspection and palpation Lungs: clear to auscultation bilaterally Breasts: normal appearance, no masses or tenderness Heart: regular rate and rhythm Abdomen: soft, non-tender; bowel sounds normal; no masses,  no organomegaly Extremities: extremities normal, atraumatic, no cyanosis or edema Skin: Skin color, texture, turgor normal. No rashes or lesions Lymph nodes: Cervical, supraclavicular, and axillary nodes normal. No abnormal inguinal nodes palpated Neurologic: Grossly normal  Pelvic: External genitalia:  no lesions              Urethra:  normal appearing urethra with no masses, tenderness or lesions              Bartholins and Skenes: normal                 Vagina: normal appearing vagina with normal color and discharge, no lesions              Cervix: absent              Pap taken: No. Bimanual Exam:  Uterus:  uterus absent              Adnexa: no mass, fullness, tenderness               Rectovaginal: Confirms               Anus:  normal sphincter tone, no lesions  Chaperone was present for exam.  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 We discussed 3D MMG and her breast density. pap smear not indicated Vit D today. On Vit D 50,000 IU weekly. Rx sent to pharmacy. Estradiol 1.0mg  daily.  Pt is going to consider decreasing dosage in 1/2.  Will not change rx at this time.  Pt to call if becomes symptomatic.   Metrogel 0.75% qhs nightly for 5 nights.  Pt to call and give report about symptoms. return annually or prn

## 2014-11-30 ENCOUNTER — Other Ambulatory Visit: Payer: Self-pay

## 2014-11-30 DIAGNOSIS — Z9289 Personal history of other medical treatment: Secondary | ICD-10-CM

## 2015-01-07 ENCOUNTER — Ambulatory Visit: Payer: No Typology Code available for payment source

## 2015-01-13 ENCOUNTER — Telehealth: Payer: Self-pay | Admitting: Obstetrics & Gynecology

## 2015-01-13 NOTE — Telephone Encounter (Signed)
Spoke with patient. Patient states that she noticed a lump on her labia yesterday that is the size of a pea. "It is right where my panty line is and it is tender." Denies any redness, warmth, or swelling to the area. Advised patient will need to be seen in office for further evaluation. Patient is agreeable. Requesting an appointment for tomorrow afternoon with Dr.Miller. Advised will speak with provider and return call with appointment times to proceed with scheduling. Patient is agreeable.

## 2015-01-13 NOTE — Telephone Encounter (Signed)
Pt has a small lump on her labia.

## 2015-01-13 NOTE — Telephone Encounter (Signed)
Spoke with patient. Appointment scheduled for tomorrow at 3pm with Dr.Miller. Patient is agreeable to date and time.  Routing to provider for final review. Patient agreeable to disposition. Patient aware MD will review message and nurse will return call with any additional instructions or change of disposition. Will close encounter.

## 2015-01-14 ENCOUNTER — Ambulatory Visit (INDEPENDENT_AMBULATORY_CARE_PROVIDER_SITE_OTHER): Payer: 59 | Admitting: Obstetrics & Gynecology

## 2015-01-14 ENCOUNTER — Other Ambulatory Visit: Payer: Self-pay

## 2015-01-14 ENCOUNTER — Ambulatory Visit
Admission: RE | Admit: 2015-01-14 | Discharge: 2015-01-14 | Disposition: A | Discharge status: Home / Self Care | Payer: 59 | Source: Ambulatory Visit

## 2015-01-14 VITALS — BP 120/72 | HR 68 | Resp 20

## 2015-01-14 DIAGNOSIS — N949 Unspecified condition associated with female genital organs and menstrual cycle: Secondary | ICD-10-CM | POA: Diagnosis not present

## 2015-01-14 DIAGNOSIS — Z1231 Encounter for screening mammogram for malignant neoplasm of breast: Secondary | ICD-10-CM

## 2015-01-14 DIAGNOSIS — N9089 Other specified noninflammatory disorders of vulva and perineum: Secondary | ICD-10-CM

## 2015-01-14 NOTE — Progress Notes (Signed)
Subjective:     Patient ID: Carolyn Rollins, female   DOB: 1955/12/29, 59 y.o.   MRN: [id number]  HPI 59 yo G3P2 MWF here for complaint of tender vulvar "mass" pt noted a few days ago.  She feels the area has gotten smaller and is not nearly as tender.  No recent bites, trauma.  No change in underwear.  Pt admits she tried to "squeeze" the lesion but nothing came out.  Was more tender after she did that.  No fevers.  No vaginal bleeding or discharge.  Review of Systems  All other systems reviewed and are negative.      Objective:   Physical Exam  Constitutional: She is oriented to person, place, and time. She appears well-developed and well-nourished.  Genitourinary: Vagina normal.    There is lesion on the right labia. There is no rash, tenderness or injury on the right labia. There is no rash, tenderness, lesion or injury on the left labia.  Surgically absent cervix and uterus.  Lymphadenopathy:       Right: No inguinal adenopathy present.       Left: No inguinal adenopathy present.  Neurological: She is alert and oriented to person, place, and time.  Psychiatric: She has a normal mood and affect.       Assessment:     Vulvar lesion--either sebaceous cyst or lipoma    Plan:     As this is small (and has gotten smaller by pt hx) feel this is ok to watch.  It is deep within labium majora so would require deeper exploration.  No skin changes so doubtful or anything worrisome.  Pt will watch and if enlarges, will call for follow up.

## 2015-01-16 ENCOUNTER — Encounter: Payer: Self-pay | Admitting: Obstetrics & Gynecology

## 2015-03-08 ENCOUNTER — Other Ambulatory Visit: Payer: Self-pay

## 2015-09-22 ENCOUNTER — Other Ambulatory Visit: Payer: Self-pay | Admitting: Obstetrics & Gynecology

## 2015-09-22 NOTE — Telephone Encounter (Signed)
Medication refill request: Vitamin D Last AEX:  09-15-14 Next AEX: 11-11-15 Last MMG (if hormonal medication request): 01-15-15 WNL Refill authorized: please advise

## 2015-11-11 ENCOUNTER — Ambulatory Visit (INDEPENDENT_AMBULATORY_CARE_PROVIDER_SITE_OTHER): Payer: 59 | Admitting: Obstetrics & Gynecology

## 2015-11-11 ENCOUNTER — Encounter: Payer: Self-pay | Admitting: Obstetrics & Gynecology

## 2015-11-11 VITALS — BP 100/66 | HR 68 | Resp 16 | Ht 63.75 in | Wt 192.0 lb

## 2015-11-11 DIAGNOSIS — Z01419 Encounter for gynecological examination (general) (routine) without abnormal findings: Secondary | ICD-10-CM | POA: Diagnosis not present

## 2015-11-11 DIAGNOSIS — Z0000 Encounter for general adult medical examination without abnormal findings: Secondary | ICD-10-CM | POA: Diagnosis not present

## 2015-11-11 LAB — POCT URINALYSIS DIPSTICK
Bilirubin, UA: NEGATIVE
GLUCOSE UA: NEGATIVE
Ketones, UA: NEGATIVE
LEUKOCYTES UA: NEGATIVE
NITRITE UA: NEGATIVE
Protein, UA: NEGATIVE
Urobilinogen, UA: NEGATIVE
pH, UA: 5

## 2015-11-11 MED ORDER — VITAMIN D (ERGOCALCIFEROL) 1.25 MG (50000 UNIT) PO CAPS: ORAL_CAPSULE | ORAL | Status: DC

## 2015-11-11 MED ORDER — VALACYCLOVIR HCL 1 G PO TABS: ORAL_TABLET | ORAL | Status: DC

## 2015-11-11 MED ORDER — ESTROGENS, CONJUGATED 0.625 MG/GM VA CREA
TOPICAL_CREAM | VAGINAL | Status: DC
Start: 2015-11-11 — End: 2016-08-08

## 2015-11-11 NOTE — Progress Notes (Signed)
60 y.o. G3P2 MarriedCaucasianF here for annual exam.  She is weaning off the estrogen.  She is on 1/2 tab every three days.  Denies vaginal bleeding.  She is having some vaginal dryness.    PCP:  Dr. Inda Merlin.  Did blood work with her in November for HbA1c.  Has physical exam in May.  Exam from May showed Vit D was 36.  She is also.   Gives blood to the [facility] regularly.   Patient's last menstrual period was 09/11/2008.          Sexually active: Yes.    The current method of family planning is status post hysterectomy.    Exercising: Yes.    Walking 1 - 2 x weekly Smoker:  no  Health Maintenance: Pap:  08/31/2008 Normal  History of abnormal Pap:  no MMG: 01/15/15 BIRADS1:Neg  Colonoscopy:  01/2015 Polyps-  Dr. Collene Mares, repeat 5 years  BMD: 07/12/12 Normal  TDaP: UTD w/ PCP Screening Labs: PCP, Urine today: RBC=Trace   reports that she has never smoked. She has never used smokeless tobacco. She reports that she drinks about 0.5 - 1.0 oz of alcohol per week. She reports that she does not use illicit drugs.  Past Medical History  Diagnosis Date  . Elevated LDL cholesterol level   . Menorrhagia   . Obesity 12/06  . Diabetes ([place]) 2013  . Vitreous detachment of right eye     Past Surgical History  Procedure Laterality Date  . Abdominal hysterectomy  12/22/08    LAVH/BSO  . Dilation and curettage of uterus    . Lipoma removal  02/2010    Current Outpatient Prescriptions  Medication Sig Dispense Refill  . cholecalciferol (VITAMIN D) 1000 UNITS tablet Take 1,000 Units by mouth daily.    Marland Kitchen estradiol (ESTRACE) 1 MG tablet Take 1 tablet (1 mg total) by mouth daily. (Patient taking differently: Take 0.5 mg by mouth daily. ) 30 tablet 12  . latanoprost (XALATAN) 0.005 % ophthalmic solution     . LINZESS 290 MCG CAPS capsule Take 290 mcg by mouth daily.   4  . metFORMIN (GLUCOPHAGE-XR) 500 MG 24 hr tablet Take 500 mg by mouth daily with breakfast.    . Multiple Vitamin (MULTIVITAMIN) tablet  Take 1 tablet by mouth daily.    . ONE TOUCH ULTRA TEST test strip     . Probiotic Product (PROBIOTIC ACIDOPHILUS) CAPS Take by mouth daily.    . valACYclovir (VALTREX) 1000 MG tablet As needed    . Vitamin D, Ergocalciferol, (DRISDOL) 50000 units CAPS capsule TAKE ONE CAPSULE BY MOUTH EVERY 7 DAYS 12 capsule 3   No current facility-administered medications for this visit.    Family History  Problem Relation Age of Onset  . Asthma Mother   . Emphysema Father   . Cancer Sister     bladder cancer  . Emphysema Sister   . Cancer Brother     lung cancer    ROS:  Pertinent items are noted in HPI.  Otherwise, a comprehensive ROS was negative.  Exam:   BP 100/66 mmHg  Pulse 68  Resp 16  Ht 5' 3.75" (1.619 m)  Wt 192 lb (87.091 kg)  BMI 33.23 kg/m2  LMP 09/11/2008  Weight change: -14#  Height: 5' 3.75" (161.9 cm)  Ht Readings from Last 3 Encounters:  11/11/15 5' 3.75" (1.619 m)  09/15/14 5' 3.5" (1.613 m)  08/21/13 5\' 4"  (1.626 m)    General appearance: alert, cooperative  and appears stated age Head: Normocephalic, without obvious abnormality, atraumatic Neck: no adenopathy, supple, symmetrical, trachea midline and thyroid normal to inspection and palpation Lungs: clear to auscultation bilaterally Breasts: normal appearance, no masses or tenderness Heart: regular rate and rhythm Abdomen: soft, non-tender; bowel sounds normal; no masses,  no organomegaly Extremities: extremities normal, atraumatic, no cyanosis or edema Skin: Skin color, texture, turgor normal. No rashes or lesions Lymph nodes: Cervical, supraclavicular, and axillary nodes normal. No abnormal inguinal nodes palpated Neurologic: Grossly normal   Pelvic: External genitalia:  no lesions              Urethra:  normal appearing urethra with no masses, tenderness or lesions              Bartholins and Skenes: normal                 Vagina: normal appearing vagina with normal color and discharge, no lesions               Cervix: absent              Pap taken: No. Bimanual Exam:  Uterus:  uterus absent              Adnexa: no mass, fullness, tenderness               Rectovaginal: Confirms               Anus:  normal sphincter tone, no lesions  Chaperone was present for exam.  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  Pt advised to stop oral estrogen now Vit D deficiency IBS Mild hot flashes SUI  P: Mammogram yearly. We discussed 3D MMG and her breast density. pap smear not indicated Vit D today. On Vit D 50,000 IU weekly. Rx sent to pharmacy. Valtrex 1gm as needed.  #30/0RF Trial of Premarin vaginal cream 1/2 gm twice weekly.  Rx to pharmacy. D/W pt surgical treatment for SUI.  Declines referral at this time. return annually or prn

## 2015-11-18 ENCOUNTER — Other Ambulatory Visit: Payer: Self-pay | Admitting: *Deleted

## 2015-11-18 MED ORDER — VITAMIN D 1000 UNITS PO TABS: 1000.0000 [IU] | ORAL_TABLET | Freq: Every day | ORAL | Status: DC

## 2015-11-18 NOTE — Telephone Encounter (Signed)
Patient insurance requires RX to be sent to [facility]. I corrected the Vitamin D RX and sent it to her mail in pharmacy-[facility]

## 2015-11-22 ENCOUNTER — Other Ambulatory Visit: Payer: Self-pay | Admitting: *Deleted

## 2015-11-22 MED ORDER — VALACYCLOVIR HCL 1 G PO TABS
ORAL_TABLET | ORAL | Status: DC
Start: 2015-11-22 — End: 2017-07-16

## 2015-12-15 ENCOUNTER — Other Ambulatory Visit: Payer: Self-pay

## 2015-12-15 DIAGNOSIS — Z1231 Encounter for screening mammogram for malignant neoplasm of breast: Secondary | ICD-10-CM

## 2015-12-23 ENCOUNTER — Telehealth: Payer: Self-pay | Admitting: *Deleted

## 2015-12-23 MED ORDER — VITAMIN D (ERGOCALCIFEROL) 1.25 MG (50000 UNIT) PO CAPS: ORAL_CAPSULE | ORAL | Status: DC

## 2015-12-23 NOTE — Telephone Encounter (Signed)
Fax from [facility] requesting refill of Vitamin D 50,000  11/11/15 Vitamin D 50,000 iu's #12/4/rfs was sent to [facility]. Vitamin D 50,000 iu's #12/4/rfs sent to [facility].  Routed to provider for review, encounter closed.

## 2016-01-17 ENCOUNTER — Ambulatory Visit
Admission: RE | Admit: 2016-01-17 | Discharge: 2016-01-17 | Disposition: A | Discharge status: Home / Self Care | Payer: 59 | Source: Ambulatory Visit

## 2016-01-17 ENCOUNTER — Telehealth: Payer: Self-pay | Admitting: Obstetrics & Gynecology

## 2016-01-17 DIAGNOSIS — Z1231 Encounter for screening mammogram for malignant neoplasm of breast: Secondary | ICD-10-CM

## 2016-01-17 NOTE — Telephone Encounter (Signed)
Patient is asking to talk with a nurse regarding her Premarin prescription, no further details given.

## 2016-01-17 NOTE — Telephone Encounter (Signed)
Annual 11-11-15 with Dr Sabra Heck.

## 2016-01-17 NOTE — Telephone Encounter (Signed)
Return call to patient. Reports has areas of "excoriation" near anal and vaginal area. States she is unsure if this is related to Preparation H or Premarin cream but this is the second time it has occurred in three weeks and she has use both of these products. Pain has decreased today but still uncomfortable. Using A&D ointment. Offered appointment today with NP and patient declines, prefers to wait till tomorrow to see Dr Sabra Heck. Appointment tomorrow at 945 with Dr Sabra Heck. Advised dr Sabra Heck will review call. Will call back if any additional instructions prior to appointment. Advised not to use any other medication to are except the A&D ointment prior to appointment.  Routing to provider for final review. Patient agreeable to disposition. Will close encounter.

## 2016-01-18 ENCOUNTER — Ambulatory Visit (INDEPENDENT_AMBULATORY_CARE_PROVIDER_SITE_OTHER): Payer: 59 | Admitting: Obstetrics & Gynecology

## 2016-01-18 VITALS — BP 112/60 | HR 78 | Resp 12 | Wt 192.0 lb

## 2016-01-18 DIAGNOSIS — L292 Pruritus vulvae: Secondary | ICD-10-CM | POA: Diagnosis not present

## 2016-01-18 LAB — POCT URINALYSIS DIPSTICK
Bilirubin, UA: NEGATIVE
Blood, UA: NEGATIVE
Glucose, UA: NEGATIVE
KETONES UA: NEGATIVE
Leukocytes, UA: NEGATIVE
Nitrite, UA: NEGATIVE
PROTEIN UA: NEGATIVE
Urobilinogen, UA: NEGATIVE
pH, UA: 5

## 2016-01-18 MED ORDER — CLOBETASOL PROPIONATE 0.05 % EX OINT: 1.0000 "application " | TOPICAL_OINTMENT | Freq: Two times a day (BID) | CUTANEOUS | Status: DC

## 2016-01-18 NOTE — Progress Notes (Signed)
GYNECOLOGY  VISIT   HPI: 60 y.o. G3P2 Married Caucasian female with complaint of rectal irritation.  Pt thinks this started about three months ago.  She used some OTC vagisil and then used some Clobetasol ointment.  That didn't really fix the problem.  Pt reports she then had to have a root canal and was on amoxicillin for 10 days.  She felt the irritation, itching worsened.  About two weeks ago, the itching and irritation intensified.  She then used A&D ointment which improved the symptoms.  She feels the perirectal tissue is swollen and this was making having bowel movements really painful.  She has also used preparation H and she's used gold bond powder.  Nothing's really made it better except A&D ointment and stopping all the other products.  Pt has been on premarin vaginal cream for vaginal dryness and pain with intercourse.  She thinks this has helped with the vaginal dryness.  Now she's wondering if this is contributing as it seems the irritation has really improved since not using the Premarin cream.     GYNECOLOGIC HISTORY: Patient's last menstrual period was 09/11/2008.   Patient Active Problem List   Diagnosis Date Noted  . Diabetes ([place]) 09/15/2014    Past Medical History  Diagnosis Date  . Elevated LDL cholesterol level   . Menorrhagia   . Obesity 12/06  . Diabetes ([place]) 2013  . Vitreous detachment of right eye     Past Surgical History  Procedure Laterality Date  . Abdominal hysterectomy  12/22/08    LAVH/BSO  . Dilation and curettage of uterus    . Lipoma removal  02/2010    MEDS:  Reviewed in EPIC and UTD  ALLERGIES: Other  Family History  Problem Relation Age of Onset  . Asthma Mother   . Emphysema Father   . Cancer Sister     bladder cancer  . Emphysema Sister   . Cancer Brother     lung cancer    SH:  Married, non smoker  Review of Systems  All other systems reviewed and are negative.   PHYSICAL EXAMINATION:    BP 112/60 mmHg  Pulse 78  Resp  12  Wt 192 lb (87.091 kg)  LMP 09/11/2008     Physical Exam  Constitutional: She is oriented to person, place, and time. She appears well-developed and well-nourished.  Genitourinary: Vagina normal.    There is no rash, tenderness, lesion or injury on the right labia. There is no rash, tenderness, lesion or injury on the left labia. Right adnexum displays no mass and no tenderness. Left adnexum displays no mass and no tenderness. No vaginal discharge found.  Surgically absent cervix and uterus.  Lymphadenopathy:       Right: No inguinal adenopathy present.       Left: No inguinal adenopathy present.  Neurological: She is alert and oriented to person, place, and time.  Skin: Skin is warm and dry.   Affirm obtained  Chaperone was present for exam.  Assessment: Vulvar and perirectal irritation Possible contact dermatitis from Premarin cream  Plan: Affirm pending If yeast is present, will treat with Diflucan.  If not, will have pt stop Premarin cream and switch to estradiol 26meq tab pv twice weekly. rx for Clobetasol 0.05% ointment apply BID.  #60gm/1RF.  Due to cost of Vagifem, pt will try estrace vaginal cream 1 gm pv twice weekly to see if causes the same irritation.  If not, will continue with this.  If  does, may need to sent letter the insurance company.

## 2016-01-19 ENCOUNTER — Other Ambulatory Visit: Payer: Self-pay | Admitting: Obstetrics & Gynecology

## 2016-01-19 ENCOUNTER — Encounter: Payer: Self-pay | Admitting: Obstetrics & Gynecology

## 2016-01-19 LAB — WET PREP BY MOLECULAR PROBE
CANDIDA SPECIES: NEGATIVE
Gardnerella vaginalis: NEGATIVE
Trichomonas vaginosis: NEGATIVE

## 2016-01-19 MED ORDER — ESTRADIOL 10 MCG VA TABS: 1.0000 | ORAL_TABLET | VAGINAL | Status: DC

## 2016-01-19 MED ORDER — ESTRADIOL 0.1 MG/GM VA CREA: TOPICAL_CREAM | VAGINAL | Status: DC

## 2016-08-08 ENCOUNTER — Encounter: Payer: Self-pay | Admitting: Obstetrics & Gynecology

## 2016-08-08 ENCOUNTER — Ambulatory Visit (INDEPENDENT_AMBULATORY_CARE_PROVIDER_SITE_OTHER): Payer: 59 | Admitting: Obstetrics & Gynecology

## 2016-08-08 ENCOUNTER — Telehealth: Payer: Self-pay | Admitting: Obstetrics & Gynecology

## 2016-08-08 VITALS — BP 112/70 | HR 80 | Resp 16 | Ht 63.5 in | Wt 199.0 lb

## 2016-08-08 DIAGNOSIS — L02214 Cutaneous abscess of groin: Secondary | ICD-10-CM

## 2016-08-08 MED ORDER — SULFAMETHOXAZOLE-TRIMETHOPRIM 800-160 MG PO TABS: 1.0000 | ORAL_TABLET | Freq: Two times a day (BID) | ORAL | 0 refills | Status: DC

## 2016-08-08 NOTE — Telephone Encounter (Signed)
Spoke with patient. Patient states she has an area that she is not sure if it is a boil vs. ingrown hair to right of labia closer to groin. Patient states it is becoming more painful and has gotten bigger. Patient states she has been using cortisone cream and warm soaks with no relief. Patient reports red, tender and no drainage. Recommended OV for further evaluation. Patient scheduled today, 08/08/16 at 2:45pm with Dr. Sabra Heck. Patient is agreeable to date and time.   Routing to provider for final review. Patient is agreeable to disposition. Will close encounter.

## 2016-08-08 NOTE — Telephone Encounter (Signed)
Patient has a boil on inside of growing.  Says it is getting bigger an painful even though she has been using hot compress.

## 2016-08-09 NOTE — Progress Notes (Signed)
Subjective:     Patient ID: Carolyn Rollins, female   DOB: 1955/12/06, 60 y.o.   MRN: [id number]  HPI 60 yo G3P2 MWF here with two week hx of right groin lesion that is tender and red.  Pt states it feels like a boil/abscess.  She has tried to get it to drain without success.  Has used hot compresses.  Is right where her underwear rubs it and has made the area very tender.  No next sexual partners.  No vaginal discharge.  No fevers.  Past Medical History:  Diagnosis Date  . Diabetes ([place]) 2013  . Elevated LDL cholesterol level   . Menorrhagia   . Obesity 12/06  . Vitreous detachment of right eye    Past Surgical History:  Procedure Laterality Date  . ABDOMINAL HYSTERECTOMY  12/22/08   LAVH/BSO  . DILATION AND CURETTAGE OF UTERUS    . Lipoma removal  02/2010   Current Outpatient Prescriptions on File Prior to Visit  Medication Sig Dispense Refill  . cholecalciferol (VITAMIN D) 1000 units tablet Take 1 tablet (1,000 Units total) by mouth daily. 12 tablet 4  . clobetasol ointment (TEMOVATE) [id number] % Apply 1 application topically 2 (two) times daily. Apply as directed twice daily 60 g 1  . estradiol (ESTRACE) 0.1 MG/GM vaginal cream 1 gram vaginally twice weekly (Patient taking differently: Place 1 Applicatorful vaginally once a week. 1 gram vaginally twice weekly) 42.5 g 1  . latanoprost (XALATAN) 0.005 % ophthalmic solution Place 1 drop into both eyes at bedtime.     Marland Kitchen LINZESS 290 MCG CAPS capsule Take 290 mcg by mouth daily.   4  . metFORMIN (GLUCOPHAGE-XR) 500 MG 24 hr tablet Take 500 mg by mouth daily with breakfast.    . Multiple Vitamin (MULTIVITAMIN) tablet Take 1 tablet by mouth daily.    . ONE TOUCH ULTRA TEST test strip     . Probiotic Product (PROBIOTIC ACIDOPHILUS) CAPS Take by mouth daily.    . valACYclovir (VALTREX) 1000 MG tablet As needed 90 tablet 0  . Vitamin D, Ergocalciferol, (DRISDOL) 50000 units CAPS capsule TAKE ONE CAPSULE BY MOUTH EVERY 7 DAYS 12 capsule 4   No  current facility-administered medications on file prior to visit.    Allergies  Allergen Reactions  . Other     Dermabond   Soc Hx:  Married, non smoker  Review of Systems  All other systems reviewed and are negative.      Objective:   Physical Exam  Constitutional: She is oriented to person, place, and time. She appears well-developed and well-nourished.  Genitourinary: Vagina normal.    There is no rash, tenderness, lesion or injury on the right labia. There is no rash, tenderness, lesion or injury on the left labia.  Lymphadenopathy:       Right: No inguinal adenopathy present.       Left: No inguinal adenopathy present.  Neurological: She is alert and oriented to person, place, and time.  Skin: Skin is warm and dry.  Psychiatric: She has a normal mood and affect.       Assessment:     Draining right groin abscess H/o Diabetes    Plan:     Wound culture obtained Bactrim DS bid x 7 days started Pt will be called with culture.  Follow up will be determined pending response to drainage today and antibiotics Heat/warm compressed/hot tub bath discussed Anti-inflammatories for pain control reviewed.

## 2016-08-10 LAB — WOUND CULTURE
GRAM STAIN: NONE SEEN
Gram Stain: NONE SEEN
Organism ID, Bacteria: NORMAL

## 2016-08-15 ENCOUNTER — Telehealth: Payer: Self-pay | Admitting: *Deleted

## 2016-08-15 NOTE — Telephone Encounter (Signed)
-----   Message from Megan Salon, MD sent at 08/15/2016  6:17 AM EST ----- Please let pt know skin culture was negative for anything except normal skin bacteria.  How is the area?  It will likely take three weeks to be fully resolved.  If still feeling like it it present, please put on schedule for next week (as long as it is no longer tender).  She needs sooner appt if still tender.  Thanks.

## 2016-08-15 NOTE — Telephone Encounter (Signed)
Call to patient. Message given to patient as seen below from Dr. Sabra Heck. Patient states she is feeling much better and has finished the antibiotics. Patient states the bump has "gone down tremendously" but that it is still the size of the tip of a pencil and she can feel some thickness to it. She states the area is no longer tender. Office visit scheduled for Tuesday 08/22/16 at 1500. Patient agreeable to date and time of appointment.    Routing to provider for final review. Patient agreeable to disposition. Will close encounter.

## 2016-08-17 ENCOUNTER — Telehealth: Payer: Self-pay | Admitting: Obstetrics & Gynecology

## 2016-08-17 NOTE — Telephone Encounter (Signed)
Patient called and cancelled her appointment on 08/22/16 with Dr. Sabra Heck for a recheck. She said, "The cyst on my thigh is gone.  I can't see it and I can't feel it so I don't think I'll need the appointment."  Routing to provider for review.

## 2016-08-22 ENCOUNTER — Ambulatory Visit: Payer: Self-pay | Admitting: Obstetrics & Gynecology

## 2016-09-29 DIAGNOSIS — Z85828 Personal history of other malignant neoplasm of skin: Secondary | ICD-10-CM | POA: Diagnosis not present

## 2016-09-29 DIAGNOSIS — L821 Other seborrheic keratosis: Secondary | ICD-10-CM | POA: Diagnosis not present

## 2016-09-29 DIAGNOSIS — D2371 Other benign neoplasm of skin of right lower limb, including hip: Secondary | ICD-10-CM | POA: Diagnosis not present

## 2016-11-16 DIAGNOSIS — H401131 Primary open-angle glaucoma, bilateral, mild stage: Secondary | ICD-10-CM | POA: Diagnosis not present

## 2016-11-16 DIAGNOSIS — H16103 Unspecified superficial keratitis, bilateral: Secondary | ICD-10-CM | POA: Diagnosis not present

## 2016-11-16 DIAGNOSIS — H04123 Dry eye syndrome of bilateral lacrimal glands: Secondary | ICD-10-CM | POA: Diagnosis not present

## 2016-12-11 ENCOUNTER — Other Ambulatory Visit: Payer: Self-pay | Admitting: Obstetrics & Gynecology

## 2016-12-11 DIAGNOSIS — Z1231 Encounter for screening mammogram for malignant neoplasm of breast: Secondary | ICD-10-CM

## 2017-01-17 ENCOUNTER — Ambulatory Visit
Admission: RE | Admit: 2017-01-17 | Discharge: 2017-01-17 | Disposition: A | Discharge status: Home / Self Care | Payer: 59 | Source: Ambulatory Visit | Attending: Obstetrics & Gynecology | Admitting: Obstetrics & Gynecology

## 2017-01-17 DIAGNOSIS — Z1231 Encounter for screening mammogram for malignant neoplasm of breast: Secondary | ICD-10-CM

## 2017-02-21 DIAGNOSIS — R7303 Prediabetes: Secondary | ICD-10-CM | POA: Diagnosis not present

## 2017-02-21 DIAGNOSIS — E559 Vitamin D deficiency, unspecified: Secondary | ICD-10-CM | POA: Diagnosis not present

## 2017-02-21 DIAGNOSIS — E7800 Pure hypercholesterolemia, unspecified: Secondary | ICD-10-CM | POA: Diagnosis not present

## 2017-02-27 ENCOUNTER — Ambulatory Visit: Payer: 59 | Admitting: Obstetrics & Gynecology

## 2017-03-01 DIAGNOSIS — R7303 Prediabetes: Secondary | ICD-10-CM | POA: Diagnosis not present

## 2017-03-01 DIAGNOSIS — K5904 Chronic idiopathic constipation: Secondary | ICD-10-CM | POA: Diagnosis not present

## 2017-03-01 DIAGNOSIS — K648 Other hemorrhoids: Secondary | ICD-10-CM | POA: Diagnosis not present

## 2017-03-19 ENCOUNTER — Encounter: Payer: Self-pay | Admitting: Obstetrics & Gynecology

## 2017-03-19 ENCOUNTER — Ambulatory Visit (INDEPENDENT_AMBULATORY_CARE_PROVIDER_SITE_OTHER): Payer: 59 | Admitting: Obstetrics & Gynecology

## 2017-03-19 VITALS — BP 114/68 | HR 70 | Resp 14 | Ht 63.75 in | Wt 195.2 lb

## 2017-03-19 DIAGNOSIS — N393 Stress incontinence (female) (male): Secondary | ICD-10-CM | POA: Diagnosis not present

## 2017-03-19 DIAGNOSIS — E2839 Other primary ovarian failure: Secondary | ICD-10-CM

## 2017-03-19 DIAGNOSIS — Z01419 Encounter for gynecological examination (general) (routine) without abnormal findings: Secondary | ICD-10-CM | POA: Diagnosis not present

## 2017-03-19 MED ORDER — CLOBETASOL PROPIONATE 0.05 % EX OINT: 1.0000 "application " | TOPICAL_OINTMENT | Freq: Two times a day (BID) | CUTANEOUS | 1 refills | Status: DC

## 2017-03-19 NOTE — Progress Notes (Signed)
61 y.o. G3P2 MarriedCaucasianF here for annual exam.  Doing well.  Having a little more issues with vulvar itching/irritation.  Would like me to look today to make sure nothing "is going on".    Pt reports she is having a little increased issue with urinary urgency in the morning especially when her bladder is full.    PCP:  Dr.  Inda Merlin.  Last visit was December for routine exam.  Follow up was done in May.  HbA1C was 6.4.  On Metformin.    Patient's last menstrual period was 09/11/2008.          Sexually active: Yes.    The current method of family planning is status post hysterectomy.    Exercising: Yes.    walking Smoker:  no  Health Maintenance: Pap:  08/31/08 normal   History of abnormal Pap:  no MMG:  01/17/17 BIRADS 1 negative  Colonoscopy:  5/16 Dr. Collene Mares- repeat 5 years  BMD:   07/12/12 normal  TDaP:  PCP Pneumonia vaccine(s):  never Zostavax:   Never- will get with PCP at next appointment Hep C testing: patient has donated blood within last 2 years Screening Labs: PCP, Hb today: PCP   reports that she has never smoked. She has never used smokeless tobacco. She reports that she drinks about 0.5 - 1.0 oz of alcohol per week . She reports that she does not use drugs.  Past Medical History:  Diagnosis Date  . Diabetes ([place]) 2013  . Elevated LDL cholesterol level   . Menorrhagia   . Obesity 12/06  . Vitreous detachment of right eye     Past Surgical History:  Procedure Laterality Date  . ABDOMINAL HYSTERECTOMY  12/22/08   LAVH/BSO  . DILATION AND CURETTAGE OF UTERUS    . Lipoma removal  02/2010    Current Outpatient Prescriptions  Medication Sig Dispense Refill  . cholecalciferol (VITAMIN D) 1000 units tablet Take 1 tablet (1,000 Units total) by mouth daily. 12 tablet 4  . clobetasol ointment (TEMOVATE) 2.99 % Apply 1 application topically 2 (two) times daily. Apply as directed twice daily 60 g 1  . docusate sodium (COLACE) 100 MG capsule     . hydrocortisone (ANUSOL-HC)  2.5 % rectal cream Place 1 application rectally as needed for hemorrhoids or anal itching.    . latanoprost (XALATAN) 0.005 % ophthalmic solution Place 1 drop into both eyes at bedtime.     . metFORMIN (GLUCOPHAGE-XR) 500 MG 24 hr tablet Take 500 mg by mouth daily with breakfast.    . Multiple Vitamin (MULTIVITAMIN) tablet Take 1 tablet by mouth daily.    . ONE TOUCH ULTRA TEST test strip     . Psyllium (METAMUCIL FIBER SINGLES PO)     . valACYclovir (VALTREX) 1000 MG tablet As needed 90 tablet 0  . Vitamin D, Ergocalciferol, (DRISDOL) 50000 units CAPS capsule TAKE ONE CAPSULE BY MOUTH EVERY 7 DAYS 12 capsule 4   No current facility-administered medications for this visit.     Family History  Problem Relation Age of Onset  . Asthma Mother   . Emphysema Father   . Cancer Sister        bladder cancer  . Emphysema Sister   . Diabetes Sister   . Cancer Brother        lung cancer    ROS:  Pertinent items are noted in HPI.  Otherwise, a comprehensive ROS was negative.  Exam:   BP 114/68 (BP Location: Right Arm,  Patient Position: Sitting, Cuff Size: Normal)   Pulse 70   Resp 14   Ht 5' 3.75" (1.619 m)   Wt 195 lb 4 oz (88.6 kg)   LMP 09/11/2008   BMI 33.78 kg/m   Weight change: +3#   Height: 5' 3.75" (161.9 cm)  Ht Readings from Last 3 Encounters:  03/19/17 5' 3.75" (1.619 m)  08/08/16 5' 3.5" (1.613 m)  11/11/15 5' 3.75" (1.619 m)    General appearance: alert, cooperative and appears stated age Head: Normocephalic, without obvious abnormality, atraumatic Neck: no adenopathy, supple, symmetrical, trachea midline and thyroid normal to inspection and palpation Lungs: clear to auscultation bilaterally Breasts: normal appearance, no masses or tenderness Heart: regular rate and rhythm Abdomen: soft, non-tender; bowel sounds normal; no masses,  no organomegaly Extremities: extremities normal, atraumatic, no cyanosis or edema Skin: Skin color, texture, turgor normal. No rashes or  lesions Lymph nodes: Cervical, supraclavicular, and axillary nodes normal. No abnormal inguinal nodes palpated Neurologic: Grossly normal   Pelvic: External genitalia:  no lesions              Urethra:  normal appearing urethra with no masses, tenderness or lesions              Bartholins and Skenes: normal                 Vagina: normal appearing vagina with normal color and discharge, no lesions              Cervix: absent              Pap taken: No. Bimanual Exam:  Uterus:  uterus absent              Adnexa: no mass, fullness, tenderness               Rectovaginal: Confirms               Anus:  normal sphincter tone, no lesions  Chaperone was present for exam.  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  Off HRT. Vit D deficiency IBS, off Linzess Mild SUI Type 2 DB  P:   Mammogram guidelines reviewed pap smear not indicated Now using 50K VIt D monthly due to recent blood level No rx for valtrex needed.  Pt will call when wants RF.  Will do through mail order for #90/ORF at that time Order for BMD to be done this year Referral made to pelvic PT Rx for clobetasol 0.05% ointment bid prn.  #30gm/0RF return annually or prn

## 2017-04-04 DIAGNOSIS — M6281 Muscle weakness (generalized): Secondary | ICD-10-CM | POA: Diagnosis not present

## 2017-04-11 DIAGNOSIS — M6281 Muscle weakness (generalized): Secondary | ICD-10-CM | POA: Diagnosis not present

## 2017-04-23 DIAGNOSIS — M6281 Muscle weakness (generalized): Secondary | ICD-10-CM | POA: Diagnosis not present

## 2017-04-23 DIAGNOSIS — M62838 Other muscle spasm: Secondary | ICD-10-CM | POA: Diagnosis not present

## 2017-05-01 ENCOUNTER — Other Ambulatory Visit: Payer: Self-pay | Admitting: Obstetrics & Gynecology

## 2017-05-01 DIAGNOSIS — Z1231 Encounter for screening mammogram for malignant neoplasm of breast: Secondary | ICD-10-CM

## 2017-05-08 DIAGNOSIS — M62838 Other muscle spasm: Secondary | ICD-10-CM | POA: Diagnosis not present

## 2017-05-08 DIAGNOSIS — R3915 Urgency of urination: Secondary | ICD-10-CM | POA: Diagnosis not present

## 2017-05-16 DIAGNOSIS — H401131 Primary open-angle glaucoma, bilateral, mild stage: Secondary | ICD-10-CM | POA: Diagnosis not present

## 2017-05-17 DIAGNOSIS — H5213 Myopia, bilateral: Secondary | ICD-10-CM | POA: Diagnosis not present

## 2017-05-17 DIAGNOSIS — H04123 Dry eye syndrome of bilateral lacrimal glands: Secondary | ICD-10-CM | POA: Diagnosis not present

## 2017-05-17 DIAGNOSIS — H43813 Vitreous degeneration, bilateral: Secondary | ICD-10-CM | POA: Diagnosis not present

## 2017-05-17 DIAGNOSIS — H401131 Primary open-angle glaucoma, bilateral, mild stage: Secondary | ICD-10-CM | POA: Diagnosis not present

## 2017-05-22 DIAGNOSIS — M6281 Muscle weakness (generalized): Secondary | ICD-10-CM | POA: Diagnosis not present

## 2017-05-22 DIAGNOSIS — R3915 Urgency of urination: Secondary | ICD-10-CM | POA: Diagnosis not present

## 2017-06-05 DIAGNOSIS — M62838 Other muscle spasm: Secondary | ICD-10-CM | POA: Diagnosis not present

## 2017-06-05 DIAGNOSIS — R3915 Urgency of urination: Secondary | ICD-10-CM | POA: Diagnosis not present

## 2017-06-20 DIAGNOSIS — M62838 Other muscle spasm: Secondary | ICD-10-CM | POA: Diagnosis not present

## 2017-07-05 DIAGNOSIS — Z23 Encounter for immunization: Secondary | ICD-10-CM | POA: Diagnosis not present

## 2017-07-16 ENCOUNTER — Other Ambulatory Visit: Payer: Self-pay | Admitting: Obstetrics & Gynecology

## 2017-07-16 NOTE — Telephone Encounter (Signed)
Medication refill request: valtrex Last AEX:  03/19/17 SM  Next AEX: 05/31/18  Last MMG (if hormonal medication request): 01/17/17 BIRADS 1 negative  Refill authorized: 11/22/15 #90, 0RF

## 2017-08-22 DIAGNOSIS — N390 Urinary tract infection, site not specified: Secondary | ICD-10-CM | POA: Diagnosis not present

## 2017-08-27 DIAGNOSIS — E7800 Pure hypercholesterolemia, unspecified: Secondary | ICD-10-CM | POA: Diagnosis not present

## 2017-08-27 DIAGNOSIS — E559 Vitamin D deficiency, unspecified: Secondary | ICD-10-CM | POA: Diagnosis not present

## 2017-08-27 DIAGNOSIS — R7303 Prediabetes: Secondary | ICD-10-CM | POA: Diagnosis not present

## 2017-08-27 DIAGNOSIS — Z0000 Encounter for general adult medical examination without abnormal findings: Secondary | ICD-10-CM | POA: Diagnosis not present

## 2017-09-08 ENCOUNTER — Other Ambulatory Visit: Payer: Self-pay | Admitting: Obstetrics & Gynecology

## 2017-09-10 NOTE — Telephone Encounter (Signed)
Spoke with patient- she does not need refills at this time -eh

## 2017-09-10 NOTE — Telephone Encounter (Signed)
Left message to call regarding refill request -eh

## 2017-09-10 NOTE — Telephone Encounter (Signed)
Medication refill request: Valtrex   Last AEX:  03-19-17  Next AEX: 05-31-18  Last MMG (if hormonal medication request): 01-17-17 Refill authorized: please advise

## 2017-09-19 DIAGNOSIS — M9901 Segmental and somatic dysfunction of cervical region: Secondary | ICD-10-CM | POA: Diagnosis not present

## 2017-09-19 DIAGNOSIS — M9903 Segmental and somatic dysfunction of lumbar region: Secondary | ICD-10-CM | POA: Diagnosis not present

## 2017-09-19 DIAGNOSIS — M9902 Segmental and somatic dysfunction of thoracic region: Secondary | ICD-10-CM | POA: Diagnosis not present

## 2017-09-21 DIAGNOSIS — M9902 Segmental and somatic dysfunction of thoracic region: Secondary | ICD-10-CM | POA: Diagnosis not present

## 2017-09-21 DIAGNOSIS — M9903 Segmental and somatic dysfunction of lumbar region: Secondary | ICD-10-CM | POA: Diagnosis not present

## 2017-09-21 DIAGNOSIS — M9901 Segmental and somatic dysfunction of cervical region: Secondary | ICD-10-CM | POA: Diagnosis not present

## 2017-09-24 DIAGNOSIS — M9901 Segmental and somatic dysfunction of cervical region: Secondary | ICD-10-CM | POA: Diagnosis not present

## 2017-09-24 DIAGNOSIS — M9902 Segmental and somatic dysfunction of thoracic region: Secondary | ICD-10-CM | POA: Diagnosis not present

## 2017-09-24 DIAGNOSIS — M9903 Segmental and somatic dysfunction of lumbar region: Secondary | ICD-10-CM | POA: Diagnosis not present

## 2017-09-27 DIAGNOSIS — M9903 Segmental and somatic dysfunction of lumbar region: Secondary | ICD-10-CM | POA: Diagnosis not present

## 2017-09-27 DIAGNOSIS — M9901 Segmental and somatic dysfunction of cervical region: Secondary | ICD-10-CM | POA: Diagnosis not present

## 2017-09-27 DIAGNOSIS — M9902 Segmental and somatic dysfunction of thoracic region: Secondary | ICD-10-CM | POA: Diagnosis not present

## 2017-10-04 DIAGNOSIS — M9902 Segmental and somatic dysfunction of thoracic region: Secondary | ICD-10-CM | POA: Diagnosis not present

## 2017-10-04 DIAGNOSIS — M9903 Segmental and somatic dysfunction of lumbar region: Secondary | ICD-10-CM | POA: Diagnosis not present

## 2017-10-04 DIAGNOSIS — M9901 Segmental and somatic dysfunction of cervical region: Secondary | ICD-10-CM | POA: Diagnosis not present

## 2017-10-18 DIAGNOSIS — M9903 Segmental and somatic dysfunction of lumbar region: Secondary | ICD-10-CM | POA: Diagnosis not present

## 2017-10-18 DIAGNOSIS — M9902 Segmental and somatic dysfunction of thoracic region: Secondary | ICD-10-CM | POA: Diagnosis not present

## 2017-10-18 DIAGNOSIS — M9901 Segmental and somatic dysfunction of cervical region: Secondary | ICD-10-CM | POA: Diagnosis not present

## 2017-11-14 DIAGNOSIS — I788 Other diseases of capillaries: Secondary | ICD-10-CM | POA: Diagnosis not present

## 2017-11-14 DIAGNOSIS — Z85828 Personal history of other malignant neoplasm of skin: Secondary | ICD-10-CM | POA: Diagnosis not present

## 2017-11-14 DIAGNOSIS — L918 Other hypertrophic disorders of the skin: Secondary | ICD-10-CM | POA: Diagnosis not present

## 2017-11-15 DIAGNOSIS — M9902 Segmental and somatic dysfunction of thoracic region: Secondary | ICD-10-CM | POA: Diagnosis not present

## 2017-11-15 DIAGNOSIS — M9903 Segmental and somatic dysfunction of lumbar region: Secondary | ICD-10-CM | POA: Diagnosis not present

## 2017-11-15 DIAGNOSIS — M9901 Segmental and somatic dysfunction of cervical region: Secondary | ICD-10-CM | POA: Diagnosis not present

## 2017-11-16 DIAGNOSIS — H401131 Primary open-angle glaucoma, bilateral, mild stage: Secondary | ICD-10-CM | POA: Diagnosis not present

## 2017-11-16 DIAGNOSIS — H43813 Vitreous degeneration, bilateral: Secondary | ICD-10-CM | POA: Diagnosis not present

## 2017-11-16 DIAGNOSIS — E119 Type 2 diabetes mellitus without complications: Secondary | ICD-10-CM | POA: Diagnosis not present

## 2018-01-23 ENCOUNTER — Ambulatory Visit
Admission: RE | Admit: 2018-01-23 | Discharge: 2018-01-23 | Disposition: A | Discharge status: Home / Self Care | Payer: 59 | Source: Ambulatory Visit | Attending: Obstetrics & Gynecology | Admitting: Obstetrics & Gynecology

## 2018-01-23 DIAGNOSIS — E2839 Other primary ovarian failure: Secondary | ICD-10-CM

## 2018-01-23 DIAGNOSIS — Z780 Asymptomatic menopausal state: Secondary | ICD-10-CM | POA: Diagnosis not present

## 2018-01-23 DIAGNOSIS — Z1231 Encounter for screening mammogram for malignant neoplasm of breast: Secondary | ICD-10-CM | POA: Diagnosis not present

## 2018-02-25 DIAGNOSIS — E7800 Pure hypercholesterolemia, unspecified: Secondary | ICD-10-CM | POA: Diagnosis not present

## 2018-02-25 DIAGNOSIS — R7303 Prediabetes: Secondary | ICD-10-CM | POA: Diagnosis not present

## 2018-02-25 DIAGNOSIS — E559 Vitamin D deficiency, unspecified: Secondary | ICD-10-CM | POA: Diagnosis not present

## 2018-03-19 DIAGNOSIS — M25552 Pain in left hip: Secondary | ICD-10-CM | POA: Diagnosis not present

## 2018-03-19 DIAGNOSIS — M9906 Segmental and somatic dysfunction of lower extremity: Secondary | ICD-10-CM | POA: Diagnosis not present

## 2018-03-19 DIAGNOSIS — M9903 Segmental and somatic dysfunction of lumbar region: Secondary | ICD-10-CM | POA: Diagnosis not present

## 2018-03-20 DIAGNOSIS — Z719 Counseling, unspecified: Secondary | ICD-10-CM | POA: Diagnosis not present

## 2018-03-22 DIAGNOSIS — M25552 Pain in left hip: Secondary | ICD-10-CM | POA: Diagnosis not present

## 2018-03-22 DIAGNOSIS — M9903 Segmental and somatic dysfunction of lumbar region: Secondary | ICD-10-CM | POA: Diagnosis not present

## 2018-03-22 DIAGNOSIS — M9906 Segmental and somatic dysfunction of lower extremity: Secondary | ICD-10-CM | POA: Diagnosis not present

## 2018-03-23 DIAGNOSIS — R8290 Unspecified abnormal findings in urine: Secondary | ICD-10-CM | POA: Diagnosis not present

## 2018-03-23 DIAGNOSIS — J069 Acute upper respiratory infection, unspecified: Secondary | ICD-10-CM | POA: Diagnosis not present

## 2018-03-28 DIAGNOSIS — M25552 Pain in left hip: Secondary | ICD-10-CM | POA: Diagnosis not present

## 2018-03-28 DIAGNOSIS — M9906 Segmental and somatic dysfunction of lower extremity: Secondary | ICD-10-CM | POA: Diagnosis not present

## 2018-03-28 DIAGNOSIS — M9903 Segmental and somatic dysfunction of lumbar region: Secondary | ICD-10-CM | POA: Diagnosis not present

## 2018-04-04 DIAGNOSIS — Z719 Counseling, unspecified: Secondary | ICD-10-CM | POA: Diagnosis not present

## 2018-04-11 DIAGNOSIS — M9906 Segmental and somatic dysfunction of lower extremity: Secondary | ICD-10-CM | POA: Diagnosis not present

## 2018-04-11 DIAGNOSIS — Z719 Counseling, unspecified: Secondary | ICD-10-CM | POA: Diagnosis not present

## 2018-04-11 DIAGNOSIS — M25552 Pain in left hip: Secondary | ICD-10-CM | POA: Diagnosis not present

## 2018-04-11 DIAGNOSIS — M9903 Segmental and somatic dysfunction of lumbar region: Secondary | ICD-10-CM | POA: Diagnosis not present

## 2018-04-18 DIAGNOSIS — Z719 Counseling, unspecified: Secondary | ICD-10-CM | POA: Diagnosis not present

## 2018-04-25 DIAGNOSIS — Z719 Counseling, unspecified: Secondary | ICD-10-CM | POA: Diagnosis not present

## 2018-05-23 DIAGNOSIS — H43811 Vitreous degeneration, right eye: Secondary | ICD-10-CM | POA: Diagnosis not present

## 2018-05-23 DIAGNOSIS — E119 Type 2 diabetes mellitus without complications: Secondary | ICD-10-CM | POA: Diagnosis not present

## 2018-05-23 DIAGNOSIS — H401131 Primary open-angle glaucoma, bilateral, mild stage: Secondary | ICD-10-CM | POA: Diagnosis not present

## 2018-05-31 ENCOUNTER — Encounter: Payer: Self-pay | Admitting: Obstetrics & Gynecology

## 2018-05-31 ENCOUNTER — Ambulatory Visit (INDEPENDENT_AMBULATORY_CARE_PROVIDER_SITE_OTHER): Payer: 59 | Admitting: Obstetrics & Gynecology

## 2018-05-31 VITALS — BP 102/62 | HR 64 | Resp 14 | Ht 63.0 in | Wt 199.0 lb

## 2018-05-31 DIAGNOSIS — Z01419 Encounter for gynecological examination (general) (routine) without abnormal findings: Secondary | ICD-10-CM | POA: Diagnosis not present

## 2018-05-31 NOTE — Progress Notes (Signed)
62 y.o. G3P2 Married White or Caucasian female here for annual exam.   Doing well.  Continues to have vaginal dryness.  Very pleased with referral that helped in several ways with pelvic PT.  Using organic Glide.  Using more regularly.    PCP:  Dr. Inda Merlin.  Cholesterol was 232 and LDL was 130.  HbA1C was 6.5.  She is checking BS's every morning.  Less than 7 days have been >100.    Patient's last menstrual period was 09/11/2008.          Sexually active: Yes.    The current method of family planning is status post hysterectomy.    Exercising: Yes.    Home exercise routine includes walking 1 hrs per day. Smoker:  no  Health Maintenance:  Pap:  08/31/2008 Normal.  History of abnormal Pap:  no MMG:  01/23/18 BIRADS1:neg  Colonoscopy:  01/26/15 Polyps.  BMD:   01/23/18 Normal.  TDaP:  10/11/11 Pneumonia vaccine(s):  no Shingrix:   none Hep C testing: Donated blod Screening Labs: done with Dr. Inda Merlin   reports that she has never smoked. She has never used smokeless tobacco. She reports that she drinks about 1.0 - 2.0 standard drinks of alcohol per week. She reports that she does not use drugs.  Past Medical History:  Diagnosis Date  . Diabetes ([place]) 2013  . Elevated LDL cholesterol level   . Menorrhagia   . Obesity 12/06  . Vitreous detachment of right eye     Past Surgical History:  Procedure Laterality Date  . ABDOMINAL HYSTERECTOMY  12/22/08   LAVH/BSO  . BREAST CYST ASPIRATION Right   . DILATION AND CURETTAGE OF UTERUS    . Lipoma removal  02/2010    Current Outpatient Medications  Medication Sig Dispense Refill  . cholecalciferol (VITAMIN D) 1000 units tablet Take 1 tablet (1,000 Units total) by mouth daily. 12 tablet 4  . clobetasol ointment (TEMOVATE) 3.53 % Apply 1 application topically 2 (two) times daily. Apply as directed twice daily 60 g 1  . docusate sodium (COLACE) 100 MG capsule     . hydrocortisone (ANUSOL-HC) 2.5 % rectal cream Place 1 application rectally as needed  for hemorrhoids or anal itching.    . latanoprost (XALATAN) 0.005 % ophthalmic solution Place 1 drop into both eyes at bedtime.     . metFORMIN (GLUCOPHAGE-XR) 500 MG 24 hr tablet Take 500 mg by mouth daily with breakfast.    . Multiple Vitamin (MULTIVITAMIN) tablet Take 1 tablet by mouth daily.    . ONE TOUCH ULTRA TEST test strip     . Psyllium (METAMUCIL FIBER SINGLES PO)     . valACYclovir (VALTREX) 1000 MG tablet TAKE AS NEEDED 90 tablet 0  . Vitamin D, Ergocalciferol, (DRISDOL) 50000 units CAPS capsule TAKE ONE CAPSULE BY MOUTH EVERY 7 DAYS 12 capsule 4   No current facility-administered medications for this visit.     Family History  Problem Relation Age of Onset  . Asthma Mother   . Emphysema Father   . Cancer Sister        bladder cancer  . Emphysema Sister   . Diabetes Sister   . Cancer Brother        lung cancer    Review of Systems  Gastrointestinal: Positive for constipation.    Exam:   LMP 09/11/2008   Height:      Ht Readings from Last 3 Encounters:  03/19/17 5' 3.75" (1.619 m)  08/08/16 5'  3.5" (1.613 m)  11/11/15 5' 3.75" (1.619 m)    General appearance: alert, cooperative and appears stated age Head: Normocephalic, without obvious abnormality, atraumatic Neck: no adenopathy, supple, symmetrical, trachea midline and thyroid normal to inspection and palpation Lungs: clear to auscultation bilaterally Breasts: normal appearance, no masses or tenderness Heart: regular rate and rhythm Abdomen: soft, non-tender; bowel sounds normal; no masses,  no organomegaly Extremities: extremities normal, atraumatic, no cyanosis or edema Skin: Skin color, texture, turgor normal. No rashes or lesions Lymph nodes: Cervical, supraclavicular, and axillary nodes normal. No abnormal inguinal nodes palpated Neurologic: Grossly normal   Pelvic: External genitalia:  no lesions              Urethra:  normal appearing urethra with no masses, tenderness or lesions               Bartholins and Skenes: normal                 Vagina: normal appearing vagina with normal color and discharge, no lesions              Cervix: absent              Pap taken: No. Bimanual Exam:  Uterus:  uterus absent              Adnexa: normal adnexa and no mass, fullness, tenderness               Rectovaginal: Confirms               Anus:  normal sphincter tone, no lesions  Chaperone was present for exam.  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  Off HRT. Vit D deficiency H/O IBS Mild SUI Type  2 DM  P:   Mammogram guidelines reviewed pap smear not indicated No RF for valtrex is needed.  Desires this through mail order. Will notify me when needs RF. No RF for clobetasol needed.  Will let me know if/when needs this. return annually or prn  Lab work UTD with Dr. Inda Merlin Follow up one year or prn new issues.

## 2018-05-31 NOTE — Patient Instructions (Signed)
[facility] Phone: [phone] ([phone] Location: Lower level of [facility] ([address]) Hours: 7:30 a.m. to 6:00 p.m., Monday through Friday.   [facility] Phone: [phone] Location: [address] Hours: 7:30 a.m. to 6:00 p.m., Monday through Friday.  

## 2018-06-21 DIAGNOSIS — Z23 Encounter for immunization: Secondary | ICD-10-CM | POA: Diagnosis not present

## 2018-08-21 DIAGNOSIS — M9901 Segmental and somatic dysfunction of cervical region: Secondary | ICD-10-CM | POA: Diagnosis not present

## 2018-08-21 DIAGNOSIS — M9905 Segmental and somatic dysfunction of pelvic region: Secondary | ICD-10-CM | POA: Diagnosis not present

## 2018-08-21 DIAGNOSIS — M9903 Segmental and somatic dysfunction of lumbar region: Secondary | ICD-10-CM | POA: Diagnosis not present

## 2018-08-29 DIAGNOSIS — E7800 Pure hypercholesterolemia, unspecified: Secondary | ICD-10-CM | POA: Diagnosis not present

## 2018-08-29 DIAGNOSIS — E559 Vitamin D deficiency, unspecified: Secondary | ICD-10-CM | POA: Diagnosis not present

## 2018-08-29 DIAGNOSIS — E119 Type 2 diabetes mellitus without complications: Secondary | ICD-10-CM | POA: Diagnosis not present

## 2018-08-29 DIAGNOSIS — Z0000 Encounter for general adult medical examination without abnormal findings: Secondary | ICD-10-CM | POA: Diagnosis not present

## 2018-09-03 DIAGNOSIS — E119 Type 2 diabetes mellitus without complications: Secondary | ICD-10-CM | POA: Diagnosis not present

## 2018-09-13 DIAGNOSIS — Z23 Encounter for immunization: Secondary | ICD-10-CM | POA: Diagnosis not present

## 2018-11-12 DIAGNOSIS — Z23 Encounter for immunization: Secondary | ICD-10-CM | POA: Diagnosis not present

## 2018-11-18 DIAGNOSIS — H2513 Age-related nuclear cataract, bilateral: Secondary | ICD-10-CM | POA: Diagnosis not present

## 2018-11-18 DIAGNOSIS — H401131 Primary open-angle glaucoma, bilateral, mild stage: Secondary | ICD-10-CM | POA: Diagnosis not present

## 2018-12-10 ENCOUNTER — Other Ambulatory Visit: Payer: Self-pay | Admitting: Obstetrics & Gynecology

## 2018-12-10 DIAGNOSIS — Z1231 Encounter for screening mammogram for malignant neoplasm of breast: Secondary | ICD-10-CM

## 2018-12-25 DIAGNOSIS — L438 Other lichen planus: Secondary | ICD-10-CM | POA: Diagnosis not present

## 2018-12-25 DIAGNOSIS — D2371 Other benign neoplasm of skin of right lower limb, including hip: Secondary | ICD-10-CM | POA: Diagnosis not present

## 2018-12-25 DIAGNOSIS — Z85828 Personal history of other malignant neoplasm of skin: Secondary | ICD-10-CM | POA: Diagnosis not present

## 2019-02-06 ENCOUNTER — Ambulatory Visit
Admission: RE | Admit: 2019-02-06 | Discharge: 2019-02-06 | Disposition: A | Discharge status: Home / Self Care | Payer: 59 | Source: Ambulatory Visit | Attending: Obstetrics & Gynecology | Admitting: Obstetrics & Gynecology

## 2019-02-06 ENCOUNTER — Other Ambulatory Visit: Payer: Self-pay

## 2019-02-06 DIAGNOSIS — Z1231 Encounter for screening mammogram for malignant neoplasm of breast: Secondary | ICD-10-CM

## 2019-02-07 ENCOUNTER — Other Ambulatory Visit: Payer: Self-pay | Admitting: Obstetrics & Gynecology

## 2019-02-07 DIAGNOSIS — R928 Other abnormal and inconclusive findings on diagnostic imaging of breast: Secondary | ICD-10-CM

## 2019-02-12 ENCOUNTER — Ambulatory Visit: Payer: 59

## 2019-02-12 ENCOUNTER — Ambulatory Visit
Admission: RE | Admit: 2019-02-12 | Discharge: 2019-02-12 | Disposition: A | Discharge status: Home / Self Care | Payer: 59 | Source: Ambulatory Visit | Attending: Obstetrics & Gynecology | Admitting: Obstetrics & Gynecology

## 2019-02-12 ENCOUNTER — Other Ambulatory Visit: Payer: Self-pay

## 2019-02-12 DIAGNOSIS — R928 Other abnormal and inconclusive findings on diagnostic imaging of breast: Secondary | ICD-10-CM

## 2019-08-11 ENCOUNTER — Telehealth: Payer: Self-pay | Admitting: Obstetrics & Gynecology

## 2019-08-11 NOTE — Telephone Encounter (Signed)
Left message to call Jill or Shaneil Yazdi, RN at [facility] [phone].  

## 2019-08-11 NOTE — Telephone Encounter (Signed)
Patient is asking to talk with Dr.Miller's nurse, no details given. Patient stated it was not urgent.

## 2019-08-12 NOTE — Telephone Encounter (Signed)
Spoke with pt. Pt states finding pimple on upper corner of labia x 2 months and now there is two of them. Denies pain or draining or discharge.Pt concerned because not going away. Pt has been using Replens and Vagisil and organic glide recently but stopped using these 2 days ago and instructed to discontinue until appt.Pt has used warm compresses too. Pt agreeable for OV. Pt scheduled for OV on 08/14/19 at 10am.   Will route to Dr Sabra Heck for review and any additional recommendations. Will close encounter.

## 2019-08-14 ENCOUNTER — Other Ambulatory Visit: Payer: Self-pay

## 2019-08-14 ENCOUNTER — Encounter: Payer: Self-pay | Admitting: Obstetrics & Gynecology

## 2019-08-14 ENCOUNTER — Ambulatory Visit (INDEPENDENT_AMBULATORY_CARE_PROVIDER_SITE_OTHER): Payer: 59 | Admitting: Obstetrics & Gynecology

## 2019-08-14 VITALS — BP 128/72 | HR 68 | Temp 97.3°F | Resp 12 | Wt 202.0 lb

## 2019-08-14 DIAGNOSIS — R8290 Unspecified abnormal findings in urine: Secondary | ICD-10-CM | POA: Diagnosis not present

## 2019-08-14 DIAGNOSIS — N898 Other specified noninflammatory disorders of vagina: Secondary | ICD-10-CM | POA: Diagnosis not present

## 2019-08-14 DIAGNOSIS — L723 Sebaceous cyst: Secondary | ICD-10-CM | POA: Diagnosis not present

## 2019-08-14 NOTE — Progress Notes (Signed)
GYNECOLOGY  VISIT  CC:   Patient complains of noticing "a pimple" on the the labia when she wiped about a month ago that was bulging red. Patient applied warm compresses. During this time, patient had issues with itching, constipation, and hemorrhoids. Per patient, applied Clobetasol cream to the area where the bump was. She states that she now has more than one bump. Patient states that she has an occasional vaginal odor.  HPI: 63 y.o. G3P2 Married White or Caucasian female here for labia bumps.  She noted this about a month ago.  Husband said it looked like a pimple.  She used a warm compress and it decreased in size.  Reports she had a significant issue with constipation and she had a lot of rectal and labial itching.  She used topical steroid and this helped the itching.  Now she feels a new, second, lesion that she wants evaluated.  Does use gold bond powder for moisture when it it hotter outside and she's sweating more.  Hemorrhoid is much better at this point.  It is internal.  Last colonoscopy was 01/2015.  She does have hx of polyps.  Some of the topical products she use is an organic lubricant, replens vaginal moisturizer, [place] ultragel.  She also uses Preparation H, Bold Bond powder, Clobetasol, and lotrimin.    GYNECOLOGIC HISTORY: Patient's last menstrual period was 09/11/2008. Contraception: Hysterectomy Menopausal hormone therapy: none  Patient Active Problem List   Diagnosis Date Noted  . Diabetes ([place]) 09/15/2014    Past Medical History:  Diagnosis Date  . Diabetes ([place]) 2013  . 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 Elevated LDL cholesterol level   . Menorrhagia   . Obesity 12/06  . Vitreous detachment of right eye     Past Surgical History:  Procedure Laterality Date  . ABDOMINAL HYSTERECTOMY  12/22/08   LAVH/BSO  . BREAST CYST ASPIRATION Right   . DILATION AND CURETTAGE OF UTERUS    . Lipoma removal  02/2010    MEDS:   Current Outpatient  Medications on File Prior to Visit  Medication Sig Dispense Refill  . cholecalciferol (VITAMIN D) 1000 units tablet Take 1 tablet (1,000 Units total) by mouth daily. 12 tablet 4  . docusate sodium (COLACE) 100 MG capsule     . hydrocortisone (ANUSOL-HC) 2.5 % rectal cream Place 1 application rectally as needed for hemorrhoids or anal itching.    . latanoprost (XALATAN) 0.005 % ophthalmic solution Place 1 drop into both eyes at bedtime.     . metFORMIN (GLUCOPHAGE-XR) 500 MG 24 hr tablet Take 500 mg by mouth daily with breakfast.    . ONE TOUCH ULTRA TEST test strip     . Psyllium (METAMUCIL FIBER SINGLES PO)     . valACYclovir (VALTREX) 1000 MG tablet TAKE AS NEEDED 90 tablet 0  . Vitamin D, Ergocalciferol, (DRISDOL) 50000 units CAPS capsule TAKE ONE CAPSULE BY MOUTH EVERY 7 DAYS 12 capsule 4  . Multiple Vitamin (MULTIVITAMIN) tablet Take 1 tablet by mouth daily.     No current facility-administered medications on file prior to visit.     ALLERGIES: Ciprofloxacin and Other  Family History  Problem Relation Age of Onset  . Asthma Mother   . Emphysema Father   . Cancer Sister        bladder cancer  . Emphysema Sister   . Diabetes Sister   . Cancer Brother        lung  cancer    SH:  Married, non smoker  Review of Systems  Genitourinary:       Bumps on labia Vaginal odor  All other systems reviewed and are negative.   PHYSICAL EXAMINATION:    BP 128/72 (BP Location: Right Arm, Patient Position: Sitting, Cuff Size: Large)   Pulse 68   Temp (!) 97.3 F (36.3 C) (Temporal)   Resp 12   Wt 202 lb (91.6 kg)   LMP 09/11/2008   BMI 35.78 kg/m     Physical Exam  Constitutional: She appears well-developed and well-nourished.  Genitourinary:    Vagina normal.     There is no rash, tenderness, lesion or injury on the right labia. There is no rash, tenderness, lesion or injury on the left labia.  Lymphadenopathy:       Right: No inguinal adenopathy present.       Left: No  inguinal adenopathy present.  Skin: Skin is warm and dry.  Psychiatric: She has a normal mood and affect.   Chaperone was present for exam.  Assessment: Vulvar lesions that are sebaceous cysts Vaginal odor  Plan: Pt reassured about vulvar findings Affirm and urine cultures pending

## 2019-08-15 LAB — VAGINITIS/VAGINOSIS, DNA PROBE
Candida Species: NEGATIVE
Gardnerella vaginalis: NEGATIVE
Trichomonas vaginosis: NEGATIVE

## 2019-08-16 LAB — URINE CULTURE

## 2019-09-26 ENCOUNTER — Other Ambulatory Visit: Payer: Self-pay

## 2019-09-30 ENCOUNTER — Ambulatory Visit (INDEPENDENT_AMBULATORY_CARE_PROVIDER_SITE_OTHER): Payer: 59 | Admitting: Obstetrics & Gynecology

## 2019-09-30 ENCOUNTER — Other Ambulatory Visit: Payer: Self-pay

## 2019-09-30 ENCOUNTER — Encounter: Payer: Self-pay | Admitting: Obstetrics & Gynecology

## 2019-09-30 VITALS — BP 132/70 | HR 88 | Temp 97.2°F | Resp 10 | Ht 63.5 in | Wt 202.0 lb

## 2019-09-30 DIAGNOSIS — Z01419 Encounter for gynecological examination (general) (routine) without abnormal findings: Secondary | ICD-10-CM

## 2019-09-30 MED ORDER — NONFORMULARY OR COMPOUNDED ITEM: 3 refills | Status: DC

## 2019-09-30 NOTE — Progress Notes (Signed)
64 y.o. G3P2 Married White or Caucasian female here for annual exam. Patient complains that sebaceous cysts have not gone away and she now has another one located on the labia.  She thinks this may be a little infected today as it is tender.  Wants me to check this out today.  Denies vaginal bleeding.    She is using Replens but she is still having some vulvar/vaginal dryness.  Has used vaginal estrogen cream in the past.  Newly seeing Dr. Lindell Noe.  Has some questions about being on cholesterol medication.  ACC/AHA calculation reviewed with pt.  Treatment is indicated.    Patient's last menstrual period was 09/11/2008.          Sexually active: Yes.    The current method of family planning is status post hysterectomy.    Exercising: Yes.    walking Smoker:  no  Health Maintenance: Pap:  08/31/2008 Normal History of abnormal Pap:  no MMG:  02/06/19 BIRADS 0:Incomplete; 02/12/19 Left Breast Diagnostic MMG BIRADS 1 negative/density b Colonoscopy:  01/26/15 Polyps, Dr. Collene Mares.  Pt aware this is due this year. BMD:   01/23/18 Normal TDaP:  10/11/11 Pneumonia vaccine(s):  never Shingrix:   completed Hep C testing: donates blood Screening Labs: PCP   reports that she has never smoked. She has never used smokeless tobacco. She reports current alcohol use of about 1.0 - 2.0 standard drinks of alcohol per week. She reports that she does not use drugs.  Past Medical History:  Diagnosis Date  . Diabetes ([place]) 2013  . 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 Elevated LDL cholesterol level   . Menorrhagia   . Obesity 12/06  . Vitreous detachment of right eye     Past Surgical History:  Procedure Laterality Date  . ABDOMINAL HYSTERECTOMY  12/22/08   LAVH/BSO  . BREAST CYST ASPIRATION Right   . DILATION AND CURETTAGE OF UTERUS    . Lipoma removal  02/2010    Current Outpatient Medications  Medication Sig Dispense Refill  . cholecalciferol (VITAMIN D) 1000 units  tablet Take 1 tablet (1,000 Units total) by mouth daily. 12 tablet 4  . docusate sodium (COLACE) 100 MG capsule     . hydrocortisone (ANUSOL-HC) 2.5 % rectal cream Place 1 application rectally as needed for hemorrhoids or anal itching.    . latanoprost (XALATAN) 0.005 % ophthalmic solution Place 1 drop into both eyes at bedtime.     . metFORMIN (GLUCOPHAGE-XR) 500 MG 24 hr tablet Take 500 mg by mouth daily with breakfast.    . Multiple Vitamin (MULTIVITAMIN) tablet Take 1 tablet by mouth daily.    . ONE TOUCH ULTRA TEST test strip     . pravastatin (PRAVACHOL) 10 MG tablet Take 1 tablet by mouth once a week.    . Psyllium (METAMUCIL FIBER SINGLES PO)     . valACYclovir (VALTREX) 1000 MG tablet TAKE AS NEEDED 90 tablet 0   No current facility-administered medications for this visit.    Family History  Problem Relation Age of Onset  . Asthma Mother   . Emphysema Father   . Cancer Sister        bladder cancer  . Emphysema Sister   . Diabetes Sister   . Cancer Brother        lung cancer    Review of Systems  All other systems reviewed and are negative.   Exam:   BP 132/70 (BP Location: Right Arm,  Patient Position: Sitting, Cuff Size: Normal)   Pulse 88   Temp (!) 97.2 F (36.2 C) (Temporal)   Resp 10   Ht 5' 3.5" (1.613 m)   Wt 202 lb (91.6 kg)   LMP 09/11/2008   BMI 35.22 kg/m     Height: 5' 3.5" (161.3 cm)  Ht Readings from Last 3 Encounters:  09/30/19 5' 3.5" (1.613 m)  05/31/18 5\' 3"  (1.6 m)  03/19/17 5' 3.75" (1.619 m)   General appearance: alert, cooperative and appears stated age Head: Normocephalic, without obvious abnormality, atraumatic Neck: no adenopathy, supple, symmetrical, trachea midline and thyroid normal to inspection and palpation Lungs: clear to auscultation bilaterally Breasts: normal appearance, no masses or tenderness Heart: regular rate and rhythm Abdomen: soft, non-tender; bowel sounds normal; no masses,  no organomegaly Extremities:  extremities normal, atraumatic, no cyanosis or edema Skin: Skin color, texture, turgor normal. No rashes or lesions Lymph nodes: Cervical, supraclavicular, and axillary nodes normal. No abnormal inguinal nodes palpated Neurologic: Grossly normal   Pelvic: External genitalia:  no lesions              Urethra:  normal appearing urethra with no masses, tenderness or lesions              Bartholins and Skenes: normal                 Vagina: normal appearing vagina with normal color and discharge, no lesions              Cervix: no lesions              Pap taken: No. Bimanual Exam:  Uterus:  normal size, contour, position, consistency, mobility, non-tender              Adnexa: normal adnexa and no mass, fullness, tenderness               Rectovaginal: Confirms               Anus:  normal sphincter tone, no lesions  Chaperone, Terence Lux, CMA, was present for exam.  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  Off HRT. Vit D deficiency IBS Mildly elevated lipids Type 2 DM  P:   Mammogram guidelines reviewed pap smear not indicated Lab work done with Dr. Lindell Noe Colonoscopy due later this year Vaccines are UTD Return annually or prn

## 2020-01-02 ENCOUNTER — Telehealth: Payer: Self-pay | Admitting: Obstetrics & Gynecology

## 2020-01-02 NOTE — Telephone Encounter (Signed)
Patient reports small, red "pouching, tissue" at the bottom of inner labia. She noticed it this morning. Reports pain with intercourse, is unsure if the "tissue" is the cause or vaginal dryness. She uses vit E vaginal suppositories. Denies vaginal bleeding, pelvic pain, or d/c.   OV scheduled for 4/27 at 4:30pm with Dr. Sabra Heck for further evaluation. Patient verbalizes understanding and is agreeable.   Last AEX 09/30/19  Routing to provider for final review. Patient is agreeable to disposition. Will close encounter.

## 2020-01-02 NOTE — Telephone Encounter (Signed)
Patient has an area on labia she would like check.

## 2020-01-05 NOTE — Progress Notes (Deleted)
GYNECOLOGY  VISIT  CC:   ***  HPI: 64 y.o. G3P2 Married White or Caucasian female here for labial bump.  GYNECOLOGIC HISTORY: Patient's last menstrual period was 09/11/2008. Contraception: hysterectomy Menopausal hormone therapy: none  Patient Active Problem List   Diagnosis Date Noted  . Diabetes ([place]) 09/15/2014    Past Medical History:  Diagnosis Date  . Diabetes ([place]) 2013  . 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 Elevated LDL cholesterol level   . Menorrhagia   . Obesity 12/06  . Vitreous detachment of right eye     Past Surgical History:  Procedure Laterality Date  . ABDOMINAL HYSTERECTOMY  12/22/08   LAVH/BSO  . BREAST CYST ASPIRATION Right   . DILATION AND CURETTAGE OF UTERUS    . Lipoma removal  02/2010    MEDS:   Current Outpatient Medications on File Prior to Visit  Medication Sig Dispense Refill  . cholecalciferol (VITAMIN D) 1000 units tablet Take 1 tablet (1,000 Units total) by mouth daily. 12 tablet 4  . docusate sodium (COLACE) 100 MG capsule     . hydrocortisone (ANUSOL-HC) 2.5 % rectal cream Place 1 application rectally as needed for hemorrhoids or anal itching.    . latanoprost (XALATAN) 0.005 % ophthalmic solution Place 1 drop into both eyes at bedtime.     . metFORMIN (GLUCOPHAGE-XR) 500 MG 24 hr tablet Take 500 mg by mouth daily with breakfast.    . Multiple Vitamin (MULTIVITAMIN) tablet Take 1 tablet by mouth daily.    . NONFORMULARY OR COMPOUNDED ITEM Vitamin E vaginal suppositories 200u/ml.  One pv three times weekly. 36 each 3  . ONE TOUCH ULTRA TEST test strip     . pravastatin (PRAVACHOL) 10 MG tablet Take 1 tablet by mouth once a week.    . Psyllium (METAMUCIL FIBER SINGLES PO)     . valACYclovir (VALTREX) 1000 MG tablet TAKE AS NEEDED 90 tablet 0   No current facility-administered medications on file prior to visit.    ALLERGIES: Ciprofloxacin and Other  Family History  Problem Relation Age of Onset  .  Asthma Mother   . Emphysema Father   . Cancer Sister        bladder cancer  . Emphysema Sister   . Diabetes Sister   . Cancer Brother        lung cancer    SH:  ***  Review of Systems  PHYSICAL EXAMINATION:    LMP 09/11/2008     General appearance: alert, cooperative and appears stated age Neck: no adenopathy, supple, symmetrical, trachea midline and thyroid {CHL AMB PHY EX THYROID NORM DEFAULT:[phone]::"normal to inspection and palpation"} CV:  {Exam; heart brief:31539} Lungs:  {pe lungs ob:314451::"clear to auscultation, no wheezes, rales or rhonchi, symmetric air entry"} Breasts: {Exam; breast:13139::"normal appearance, no masses or tenderness"} Abdomen: soft, non-tender; bowel sounds normal; no masses,  no organomegaly Lymph:  no inguinal LAD noted  Pelvic: External genitalia:  no lesions              Urethra:  normal appearing urethra with no masses, tenderness or lesions              Bartholins and Skenes: normal                 Vagina: normal appearing vagina with normal color and discharge, no lesions              Cervix: {CHL AMB PHY EX CERVIX NORM  [phone]::"no lesions"}              Bimanual Exam:  Uterus:  {CHL AMB PHY EX UTERUS NORM [phone]::"normal size, contour, position, consistency, mobility, non-tender"}              Adnexa: {CHL AMB PHY EX ADNEXA NO MASS DEFAULT:[phone]::"no mass, fullness, tenderness"}              Rectovaginal: {yes no:[phone]}.  Confirms.              Anus:  normal sphincter tone, no lesions  Chaperone, ***Terence Lux, CMA, was present for exam.  Assessment: ***  Plan: ***   ~{NUMBERS; -10-45 JOINT ROM:10287} minutes spent with patient >50% of time was in face to face discussion of above.

## 2020-01-06 ENCOUNTER — Other Ambulatory Visit: Payer: Self-pay

## 2020-01-06 ENCOUNTER — Ambulatory Visit (INDEPENDENT_AMBULATORY_CARE_PROVIDER_SITE_OTHER): Payer: 59 | Admitting: Obstetrics and Gynecology

## 2020-01-06 ENCOUNTER — Ambulatory Visit: Payer: Self-pay | Admitting: Obstetrics & Gynecology

## 2020-01-06 ENCOUNTER — Encounter: Payer: Self-pay | Admitting: Obstetrics and Gynecology

## 2020-01-06 ENCOUNTER — Telehealth: Payer: Self-pay | Admitting: *Deleted

## 2020-01-06 VITALS — BP 130/84 | HR 80 | Temp 97.8°F | Ht 63.5 in | Wt 190.0 lb

## 2020-01-06 DIAGNOSIS — N368 Other specified disorders of urethra: Secondary | ICD-10-CM

## 2020-01-06 DIAGNOSIS — N952 Postmenopausal atrophic vaginitis: Secondary | ICD-10-CM | POA: Diagnosis not present

## 2020-01-06 DIAGNOSIS — N760 Acute vaginitis: Secondary | ICD-10-CM | POA: Diagnosis not present

## 2020-01-06 NOTE — Telephone Encounter (Signed)
Left message for patient cancelling appointment because provider out of office. Sending to triage to assist with reschedule. Thank you

## 2020-01-06 NOTE — Patient Instructions (Signed)
Atrophic Vaginitis  Atrophic vaginitis is a condition in which the tissues that line the vagina become dry and thin. This condition is most common in women who have stopped having regular menstrual periods (are in menopause). This usually starts when a woman is 45-65 years old. That is the time when a woman's estrogen levels begin to drop (decrease). Estrogen is a female hormone. It helps to keep the tissues of the vagina moist. It stimulates the vagina to produce a clear fluid that lubricates the vagina for sexual intercourse. This fluid also protects the vagina from infection. Lack of estrogen can cause the lining of the vagina to get thinner and dryer. The vagina may also shrink in size. It may become less elastic. Atrophic vaginitis tends to get worse over time as a woman's estrogen level drops. What are the causes? This condition is caused by the normal drop in estrogen that happens around the time of menopause. What increases the risk? Certain conditions or situations may lower a woman's estrogen level, leading to a higher risk for atrophic vaginitis. You are more likely to develop this condition if:  You are taking medicines that block estrogen.  You have had your ovaries removed.  You are being treated for cancer with X-ray (radiation) or medicines (chemotherapy).  You have given birth or are breastfeeding.  You are older than age 50.  You smoke. What are the signs or symptoms? Symptoms of this condition include:  Pain, soreness, or bleeding during sexual intercourse (dyspareunia).  Vaginal burning, irritation, or itching.  Pain or bleeding when a speculum is used in a vaginal exam (pelvic exam).  Having burning pain when passing urine.  Vaginal discharge that is brown or yellow. In some cases, there are no symptoms. How is this diagnosed? This condition is diagnosed by taking a medical history and doing a physical exam. This will include a pelvic exam that checks the  vaginal tissues. Though rare, you may also have other tests, including:  A urine test.  A test that checks the acid balance in your vagina (acid balance test). How is this treated? Treatment for this condition depends on how severe your symptoms are. Treatment may include:  Using an over-the-counter vaginal lubricant before sex.  Using a long-acting vaginal moisturizer.  Using low-dose vaginal estrogen for moderate to severe symptoms that do not respond to other treatments. Options include creams, tablets, and inserts (vaginal rings). Before you use a vaginal estrogen, tell your health care provider if you have a history of: ? Breast cancer. ? Endometrial cancer. ? Blood clots. If you are not sexually active and your symptoms are very mild, you may not need treatment. Follow these instructions at home: Medicines  Take over-the-counter and prescription medicines only as told by your health care provider. Do not use herbal or alternative medicines unless your health care provider says that you can.  Use over-the-counter creams, lubricants, or moisturizers for dryness only as directed by your health care provider. General instructions  If your atrophic vaginitis is caused by menopause, discuss all of your menopause symptoms and treatment options with your health care provider.  Do not douche.  Do not use products that can make your vagina dry. These include: ? Scented feminine sprays. ? Scented tampons. ? Scented soaps.  Vaginal intercourse can help to improve blood flow and elasticity of vaginal tissue. If it hurts to have sex, try using a lubricant or moisturizer just before having intercourse. Contact a health care provider if:    Your discharge looks different than normal.  Your vagina has an unusual smell.  You have new symptoms.  Your symptoms do not improve with treatment.  Your symptoms get worse. Summary  Atrophic vaginitis is a condition in which the tissues that  line the vagina become dry and thin. It is most common in women who have stopped having regular menstrual periods (are in menopause).  Treatment options include using vaginal lubricants and low-dose vaginal estrogen.  Contact a health care provider if your vagina has an unusual smell, or if your symptoms get worse or do not improve after treatment. This information is not intended to replace advice given to you by your health care provider. Make sure you discuss any questions you have with your health care provider. Document Revised: 08/10/2017 Document Reviewed: 05/24/2017 Elsevier Patient Education  2020 Elsevier Inc.  

## 2020-01-06 NOTE — Telephone Encounter (Signed)
Spoke with patient. Patient request to r/s appt with covering provider. OV scheduled for today at 4pm with Dr. Quincy Simmonds. No change in symptoms. See telephone encounter dated 01/02/20.   [id number] prescreen negative, precautions reviewed.   Routing to provider for final review. Patient is agreeable to disposition. Will close encounter.

## 2020-01-06 NOTE — Progress Notes (Signed)
GYNECOLOGY  VISIT   HPI: 64 y.o.   Married  Caucasian  female   G3P2 with Patient's last menstrual period was 09/11/2008.   here for evaluation of red "pouching tissue" at bottom of vagina--more inside opening and complaint of dyspareunia.    She states she has a large, bleeding hemorrhoid.  She is using creams to treat it.  She looked in the mirror to check I and noted a mass like area of the vagina.   She uses Clobetasol for vulvar itching. She has used this for her hemorrhoid and having success with this.  The entire perineum is now irritated.  She is concerned about her urethra being red.   She denies vaginal discharge.  Denies dysuria.  She used Prep H wipes for cleansing.  Her soap of choice is [place].  She uses vit E suppositories for vagina moisturizing.   Patient is a Marine scientist.   GYNECOLOGIC HISTORY: Patient's last menstrual period was 09/11/2008. Contraception: Hyst Menopausal hormone therapy:  none Last mammogram:  02/06/19 3D/Lt.Br.poss.distortion, Rt.Br.Neg/BIRADS 0:Incomplete; 02/12/19 Left Breast Diagnostic [facility] BIRADS 1 negative/density b Last pap smear: 08/31/2008 Normal        OB History    Gravida  3   Para  2   Term      Preterm      AB      Living  2     SAB      TAB      Ectopic      Multiple      Live Births                 Patient Active Problem List   Diagnosis Date Noted  . Diabetes ([place]) 09/15/2014    Past Medical History:  Diagnosis Date  . Diabetes ([place]) 2013  . 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 Elevated LDL cholesterol level   . Menorrhagia   . Obesity 12/06  . Vitreous detachment of right eye     Past Surgical History:  Procedure Laterality Date  . ABDOMINAL HYSTERECTOMY  12/22/08   LAVH/BSO  . BREAST CYST ASPIRATION Right   . DILATION AND CURETTAGE OF UTERUS    . Lipoma removal  02/2010    Current Outpatient Medications  Medication Sig Dispense Refill  . Cholecalciferol  (VITAMIN D-3) 125 MCG (5000 UT) TABS Take 1 tablet by mouth daily.    . clobetasol cream (TEMOVATE) [id number] % Apply 1 application topically as needed.    . docusate sodium (COLACE) 100 MG capsule     . hydrocortisone (ANUSOL-HC) 2.5 % rectal cream Place 1 application rectally as needed for hemorrhoids or anal itching.    . Lancets (ONETOUCH DELICA PLUS [id number]) MISC Apply 1 each topically daily.    Marland Kitchen latanoprost (XALATAN) 0.005 % ophthalmic solution Place 1 drop into both eyes at bedtime.     . metFORMIN (GLUCOPHAGE-XR) 500 MG 24 hr tablet Take 500 mg by mouth daily with breakfast.    . Multiple Vitamin (MULTIVITAMIN) tablet Take 1 tablet by mouth daily.    . NONFORMULARY OR COMPOUNDED ITEM Vitamin E vaginal suppositories 200u/ml.  One pv three times weekly. 36 each 3  . ONE TOUCH ULTRA TEST test strip     . pravastatin (PRAVACHOL) 10 MG tablet Take 1 tablet by mouth once a week.    . Psyllium (METAMUCIL FIBER SINGLES PO)     . valACYclovir (VALTREX) 1000 MG tablet TAKE AS NEEDED  90 tablet 0   No current facility-administered medications for this visit.     ALLERGIES: Ciprofloxacin and Other  Family History  Problem Relation Age of Onset  . Asthma Mother   . Emphysema Father   . Cancer Sister        bladder cancer  . Emphysema Sister   . Diabetes Sister   . Cancer Brother        lung cancer    Social History   Socioeconomic History  . Marital status: Married    Spouse name: Not on file  . Number of children: Not on file  . Years of education: Not on file  . Highest education level: Not on file  Occupational History  . Not on file  Tobacco Use  . Smoking status: Never Smoker  . Smokeless tobacco: Never Used  Substance and Sexual Activity  . Alcohol use: Yes    Alcohol/week: 1.0 - 2.0 standard drinks    Types: 1 - 2 Standard drinks or equivalent per week  . Drug use: No  . Sexual activity: Yes    Partners: Male    Birth control/protection: Surgical, Post-menopausal     Comment: LAVH/BSO  Other Topics Concern  . Not on file  Social History Narrative  . Not on file   Social Determinants of Health   Financial Resource Strain:   . Difficulty of Paying Living Expenses:   Food Insecurity:   . Worried About Charity fundraiser in the Last Year:   . Arboriculturist in the Last Year:   Transportation Needs:   . Film/video editor (Medical):   Marland Kitchen Lack of Transportation (Non-Medical):   Physical Activity:   . Days of Exercise per Week:   . Minutes of Exercise per Session:   Stress:   . Feeling of Stress :   Social Connections:   . Frequency of Communication with Friends and Family:   . Frequency of Social Gatherings with Friends and Family:   . Attends Religious Services:   . Active Member of Clubs or Organizations:   . Attends Archivist Meetings:   Marland Kitchen Marital Status:   Intimate Partner Violence:   . Fear of Current or Ex-Partner:   . Emotionally Abused:   Marland Kitchen Physically Abused:   . Sexually Abused:     Review of Systems  All other systems reviewed and are negative.   PHYSICAL EXAMINATION:    BP 130/84 (Cuff Size: Large)   Pulse 80   Temp 97.8 F (36.6 C) (Temporal)   Ht 5' 3.5" (1.613 m)   Wt 190 lb (86.2 kg)   LMP 09/11/2008   BMI 33.13 kg/m     General appearance: alert, cooperative and appears stated age  Pelvic: External genitalia:  Tiny sebaceous cyst of left superior labia.              Urethra: urethral prolapse.              Bartholins and Skenes: normal                 Vagina: normal appearing vagina with normal color and discharge, no lesions.  Normal vaginal mucosal folds.              Cervix: no lesions                Bimanual Exam:  Uterus:  normal size, contour, position, consistency, mobility, non-tender  Adnexa: no mass, fullness, tenderness              Chaperone was present for exam.  ASSESSMENT  Status post LAVH/BSO.  Normal vaginal mucosa.  Urethral prolapse.  Small sebaceous  cyst.  Vulvovaginitis.  Atrophy.   PLAN  We discussed reducing product use in the vaginal/vulvar area. Vaginal atrophy reviewed.  Try Dove soap.  Vaseline is ok for moisturizing the vulva. Stop using prep H wipes.  Use Clobetasol twice a week at hs.  Consider Estradiol vaginal cream.    An After Visit Summary was printed and given to the patient.  ___20___ minutes face to face time of which over 50% was spent in counseling.

## 2020-01-06 NOTE — Telephone Encounter (Signed)
Patient is calling to reschedule appointment.

## 2020-01-08 LAB — VAGINITIS/VAGINOSIS, DNA PROBE
Candida Species: NEGATIVE
Gardnerella vaginalis: NEGATIVE
Trichomonas vaginosis: NEGATIVE

## 2020-01-12 ENCOUNTER — Other Ambulatory Visit: Payer: Self-pay | Admitting: Obstetrics & Gynecology

## 2020-01-12 DIAGNOSIS — Z1231 Encounter for screening mammogram for malignant neoplasm of breast: Secondary | ICD-10-CM

## 2020-02-11 ENCOUNTER — Ambulatory Visit
Admission: RE | Admit: 2020-02-11 | Discharge: 2020-02-11 | Disposition: A | Discharge status: Home / Self Care | Payer: 59 | Source: Ambulatory Visit | Attending: Obstetrics & Gynecology | Admitting: Obstetrics & Gynecology

## 2020-02-11 ENCOUNTER — Other Ambulatory Visit: Payer: Self-pay

## 2020-02-11 DIAGNOSIS — Z1231 Encounter for screening mammogram for malignant neoplasm of breast: Secondary | ICD-10-CM

## 2020-03-01 ENCOUNTER — Encounter: Payer: Self-pay | Admitting: Obstetrics and Gynecology

## 2020-03-01 IMAGING — MG DIGITAL SCREENING BILATERAL MAMMOGRAM WITH TOMO AND CAD
8 series · 8 of 24 positions shown · non-contrast
Comparison: Previous exam(s).

CLINICAL DATA: Screening.

EXAM:
DIGITAL SCREENING BILATERAL MAMMOGRAM WITH TOMO AND CAD

[R MLO synth-2D]
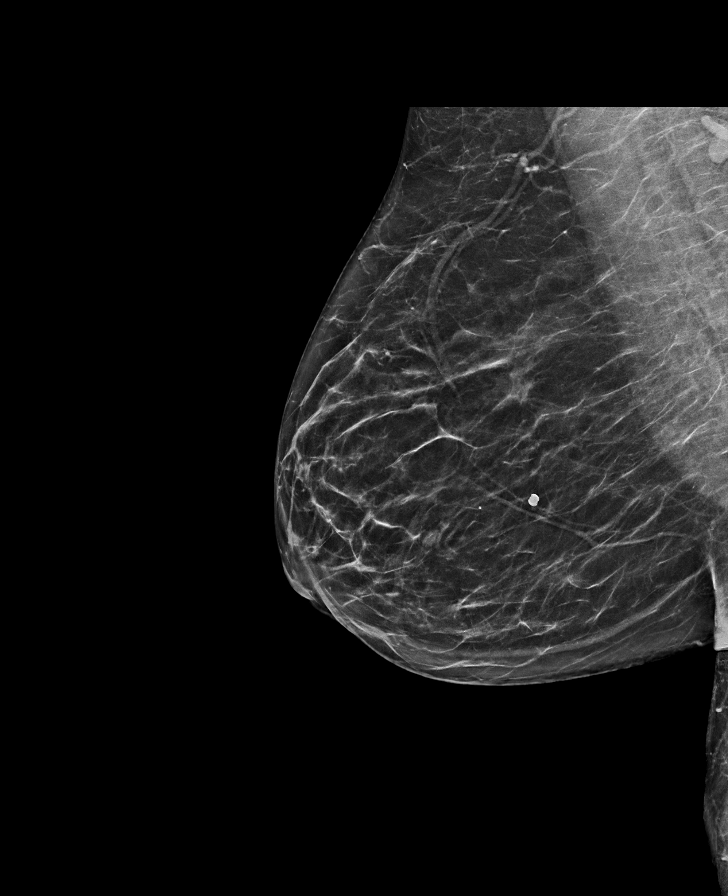

[L MLO synth-2D]
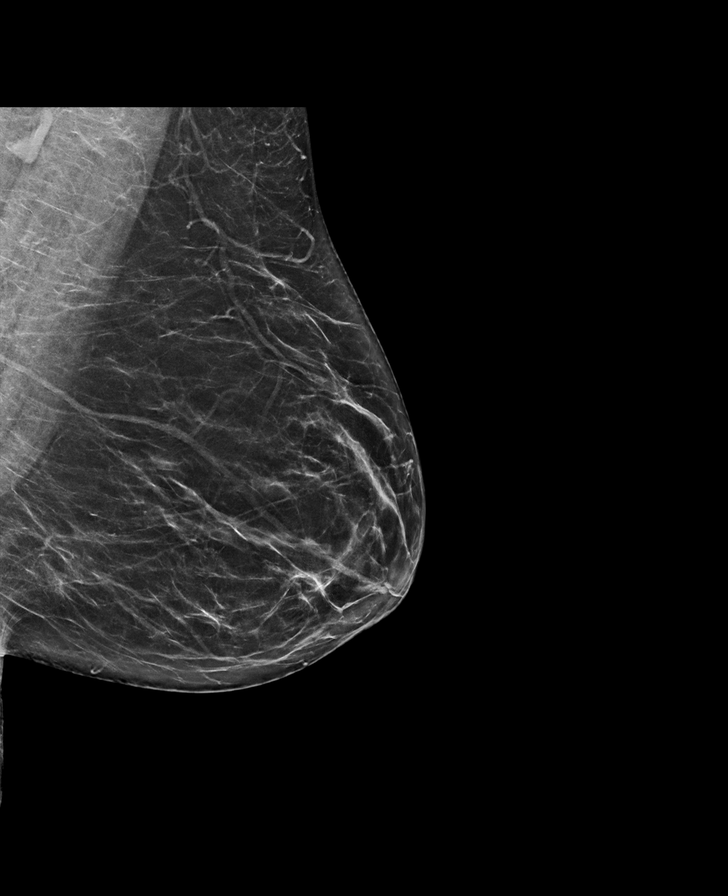

[R CC synth-2D]
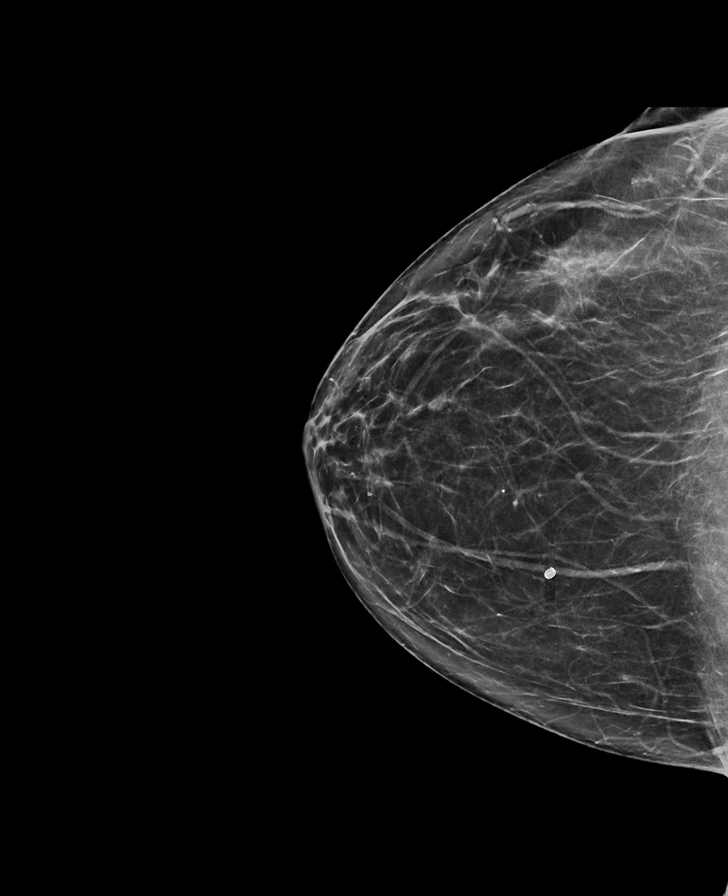

[L CC synth-2D]
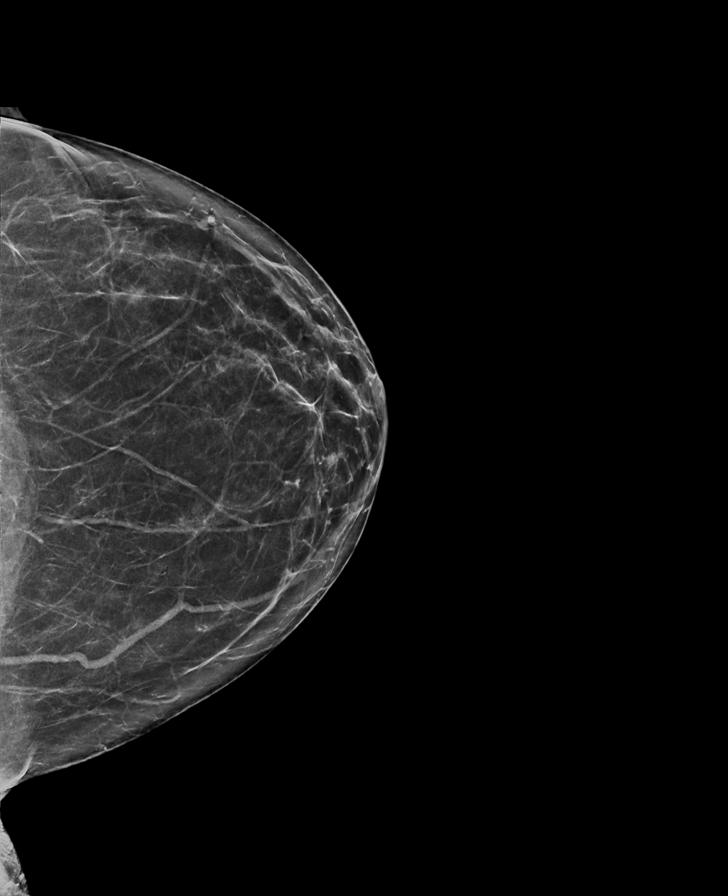

[R MLO tomo · tomo slice 37/73.0]
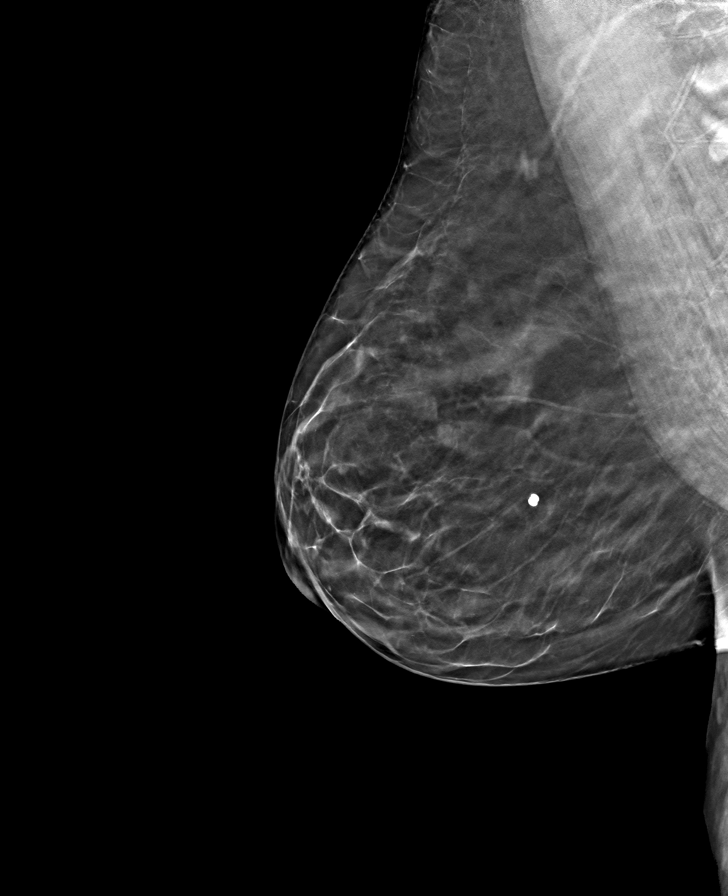

[R CC tomo · tomo slice 37/72.0]
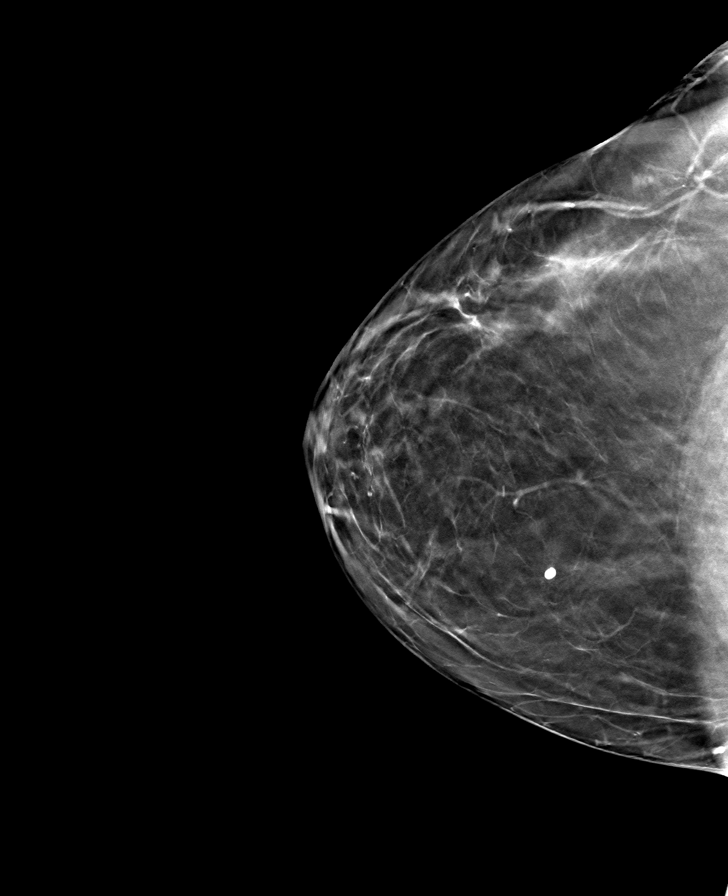

[L MLO tomo · tomo slice 37/74.0]
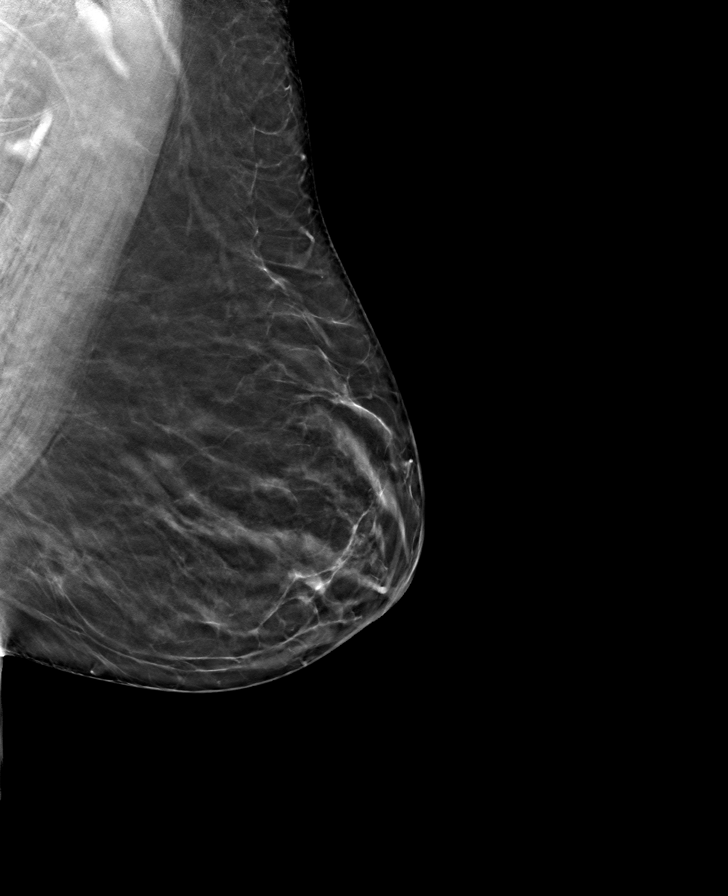

[L CC tomo · tomo slice 35/69.0]
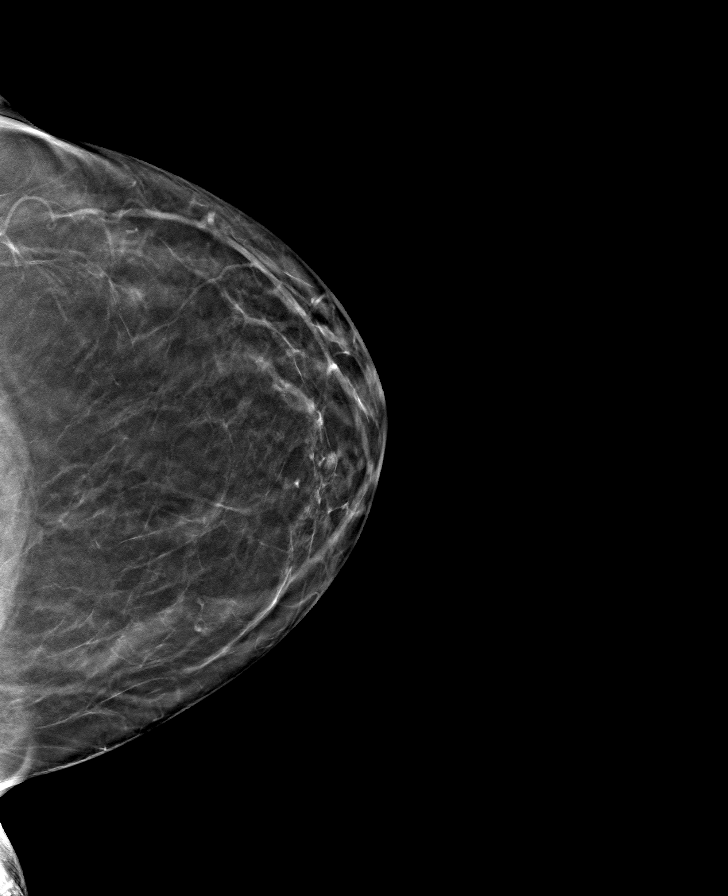

[8 of 24 positions shown; findings below may reference images not displayed]

ACR Breast Density Category b: There are scattered areas of
fibroglandular density.
FINDINGS: There are no findings suspicious for malignancy. Images were
processed with CAD.
IMPRESSION: No mammographic evidence of malignancy. A result letter of this
screening mammogram will be mailed directly to the patient.

RECOMMENDATION:
Screening mammogram in one year. (Code:[id number])

BI-RADS CATEGORY  1: Negative.

## 2020-07-09 ENCOUNTER — Encounter: Payer: Self-pay | Admitting: Obstetrics & Gynecology

## 2020-12-10 ENCOUNTER — Other Ambulatory Visit: Payer: Self-pay

## 2020-12-10 ENCOUNTER — Ambulatory Visit (INDEPENDENT_AMBULATORY_CARE_PROVIDER_SITE_OTHER): Payer: Medicare Other | Admitting: Obstetrics & Gynecology

## 2020-12-10 ENCOUNTER — Other Ambulatory Visit (HOSPITAL_BASED_OUTPATIENT_CLINIC_OR_DEPARTMENT_OTHER): Payer: Self-pay | Admitting: Obstetrics & Gynecology

## 2020-12-10 ENCOUNTER — Encounter (HOSPITAL_BASED_OUTPATIENT_CLINIC_OR_DEPARTMENT_OTHER): Payer: Self-pay | Admitting: Obstetrics & Gynecology

## 2020-12-10 VITALS — HR 76 | Ht 63.5 in | Wt 194.2 lb

## 2020-12-10 DIAGNOSIS — K069 Disorder of gingiva and edentulous alveolar ridge, unspecified: Secondary | ICD-10-CM

## 2020-12-10 DIAGNOSIS — Z90710 Acquired absence of both cervix and uterus: Secondary | ICD-10-CM | POA: Diagnosis not present

## 2020-12-10 DIAGNOSIS — Z01419 Encounter for gynecological examination (general) (routine) without abnormal findings: Secondary | ICD-10-CM

## 2020-12-10 DIAGNOSIS — E119 Type 2 diabetes mellitus without complications: Secondary | ICD-10-CM | POA: Diagnosis not present

## 2020-12-10 DIAGNOSIS — Z9189 Other specified personal risk factors, not elsewhere classified: Secondary | ICD-10-CM

## 2020-12-10 DIAGNOSIS — N8111 Cystocele, midline: Secondary | ICD-10-CM

## 2020-12-10 DIAGNOSIS — E7800 Pure hypercholesterolemia, unspecified: Secondary | ICD-10-CM | POA: Insufficient documentation

## 2020-12-10 DIAGNOSIS — E559 Vitamin D deficiency, unspecified: Secondary | ICD-10-CM | POA: Insufficient documentation

## 2020-12-10 DIAGNOSIS — B009 Herpesviral infection, unspecified: Secondary | ICD-10-CM

## 2020-12-10 DIAGNOSIS — R32 Unspecified urinary incontinence: Secondary | ICD-10-CM

## 2020-12-10 DIAGNOSIS — N816 Rectocele: Secondary | ICD-10-CM

## 2020-12-10 MED ORDER — NONFORMULARY OR COMPOUNDED ITEM: 3 refills | Status: DC

## 2020-12-10 MED ORDER — VALACYCLOVIR HCL 1 G PO TABS: ORAL_TABLET | ORAL | 2 refills | Status: AC

## 2020-12-10 NOTE — Progress Notes (Signed)
65 y.o. G3P2 Married White or Caucasian female here for breast and pelvic exam.  Denies vaginal bleeding.  Having a little urinary leakage.  She has noticed that after she voids, she sometimes has a little leakage.  She does have urgency if holds her bladder too long.  Reports she's had some intermittent vulvar skin itching this past year.  Realized she was using a lot of OTC products.  Not using vaseline.  She has a "pimple" feeling thing at the top of the labia.  She is still using Vit E vaginal suppositories and she feels a lot more "tissue" that is present.  Skin itching is much better.    Denies vaginal bleeding.    Daughter has breast cancer.  She's done chemo reducing first and then having bilateral mastectomy.  Genetic testing was negative.  ER+/PR+/her 2 +.  She is in [place].    Lab work with Dr. Lindell Noe reviewed in [place].  Patient's last menstrual period was 09/11/2008.          Sexually active: Yes.    H/O STD:  no  Health Maintenance: PCP:  Dr.  Lindell Noe.  Last wellness appt was 10/2020.  Did blood work at that appt:  yes Vaccines are up to date:  yes Colonoscopy: 03/01/20, polyp. Follow up 7 years.  Dr. Collene Mares.  MMG:  02/2020 BMD:  01/23/2018, normal Last pap smear:  Prior to surgery.   H/o abnormal pap smear:  No hx    reports that she has never smoked. She has never used smokeless tobacco. She reports current alcohol use of about 1.0 - 2.0 standard drink of alcohol per week. She reports that she does not use drugs.  Past Medical History:  Diagnosis Date  . Diabetes ([place]) 2013  . 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 Elevated LDL cholesterol level   . Menorrhagia   . Obesity 12/06  . Vitreous detachment of right eye     Past Surgical History:  Procedure Laterality Date  . ABDOMINAL HYSTERECTOMY  12/22/08   LAVH/BSO  . BREAST CYST ASPIRATION Right   . DILATION AND CURETTAGE OF UTERUS    . Lipoma removal  02/2010    Current  Outpatient Medications  Medication Sig Dispense Refill  . Cholecalciferol (VITAMIN D-3) 125 MCG (5000 UT) TABS Take 1 tablet by mouth daily.    . Lancets (ONETOUCH DELICA PLUS [id number]) MISC Apply 1 each topically daily.    Marland Kitchen latanoprost (XALATAN) 0.005 % ophthalmic solution Place 1 drop into both eyes at bedtime.     . metFORMIN (GLUCOPHAGE-XR) 500 MG 24 hr tablet Take 500 mg by mouth daily with breakfast.    . NONFORMULARY OR COMPOUNDED ITEM Vitamin E vaginal suppositories 200u/ml.  One pv three times weekly. 36 each 3  . Polyethylene Glycol 3350 (MIRALAX PO) Take by mouth.    . pravastatin (PRAVACHOL) 10 MG tablet Take 1 tablet by mouth once a week.    . Probiotic Product (PROBIOTIC DAILY PO) Take by mouth.    . valACYclovir (VALTREX) 1000 MG tablet TAKE AS NEEDED 90 tablet 0  . clobetasol cream (TEMOVATE) 4.82 % Apply 1 application topically as needed. (Patient not taking: Reported on 12/10/2020)    . docusate sodium (COLACE) 100 MG capsule     . hydrocortisone (ANUSOL-HC) 2.5 % rectal cream Place 1 application rectally as needed for hemorrhoids or anal itching.    . Multiple Vitamin (MULTIVITAMIN) tablet Take 1 tablet by mouth  daily.    . ONE TOUCH ULTRA TEST test strip      No current facility-administered medications for this visit.    Family History  Problem Relation Age of Onset  . Asthma Mother   . Emphysema Father   . Cancer Sister        bladder cancer  . Emphysema Sister   . Diabetes Sister   . Cancer Brother        lung cancer  . Breast cancer Daughter 41       Stage 2, triple positive    Review of Systems  Genitourinary: Positive for urgency.    Exam:   Pulse 76   Ht 5' 3.5" (1.613 m)   Wt 194 lb 3.2 oz (88.1 kg)   LMP 09/11/2008   SpO2 99%   BMI 33.86 kg/m   Height: 5' 3.5" (161.3 cm)  General appearance: alert, cooperative and appears stated age Breasts: normal appearance, no masses or tenderness Abdomen: soft, non-tender; bowel sounds normal; no  masses,  no organomegaly Lymph nodes: Cervical, supraclavicular, and axillary nodes normal.  No abnormal inguinal nodes palpated Neurologic: Grossly normal  Pelvic: External genitalia:  no lesions              Urethra:  normal appearing urethra with no masses, tenderness or lesions              Bartholins and Skenes: normal                 Vagina: normal appearing vagina with normal color and discharge, no lesions, 3rd degree rectocele and 2nd degree cystocele noted with Valsalva but in supine position              Cervix: absent              Pap taken: No. Bimanual Exam:  Uterus:  uterus absent              Adnexa: no mass, fullness, tenderness               Rectovaginal: Confirms               Anus:  normal sphincter tone, no lesions  Chaperone, Britt Bottom, CMA, was present for exam.  Assessment/Plan: 1. Encntr for gyn exam (general) (routine) w/o abn findings - pap not indicated - MMG 02/2020 - Colonoscopy 2021, follow up 7 years - BMD 2019.  Repeat 5 years - lab work with DR. Timberlake - vaccines updated  2. History of LAVH/BSO  3. Vitamin D deficiency  4. Type 2 diabetes mellitus without complication, without long-term current use of insulin ([place])  5. Rectocele - Ambulatory referral to Urogynecology  6. Cystocele, midline - Ambulatory referral to Urogynecology  7. 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  Dispense: 30 tablet; Refill: 2

## 2020-12-17 ENCOUNTER — Ambulatory Visit: Payer: 59

## 2020-12-29 ENCOUNTER — Other Ambulatory Visit: Payer: Self-pay | Admitting: Obstetrics & Gynecology

## 2020-12-29 DIAGNOSIS — Z1231 Encounter for screening mammogram for malignant neoplasm of breast: Secondary | ICD-10-CM

## 2021-03-09 ENCOUNTER — Ambulatory Visit
Admission: RE | Admit: 2021-03-09 | Discharge: 2021-03-09 | Disposition: A | Discharge status: Home / Self Care | Payer: Medicare Other | Source: Ambulatory Visit | Attending: Obstetrics & Gynecology | Admitting: Obstetrics & Gynecology

## 2021-03-09 ENCOUNTER — Other Ambulatory Visit: Payer: Self-pay

## 2021-03-09 DIAGNOSIS — Z1231 Encounter for screening mammogram for malignant neoplasm of breast: Secondary | ICD-10-CM

## 2021-04-12 NOTE — Progress Notes (Signed)
[facility] New Patient Evaluation and Consultation  Referring Provider: Megan Salon, MD PCP: Glenis Smoker, MD Date of Service: 04/13/2021  SUBJECTIVE Chief Complaint: New Patient (Initial Visit) (Dr. Sabra Heck referral; prolapse; c/o severe hemorrhoids)  History of Present Illness: Carolyn Rollins is a 65 y.o. White or Caucasian female seen in consultation at the request of Dr. Sabra Heck for evaluation of prolapse.    Review of records from Dr Sabra Heck significant for: Has urgency if she holds bladder too long. Prolapse noted on exam- cystocele and rectocele.  Urinary Symptoms: Leaks urine with with movement to the bathroom Unsure how often- has a "moist" feeling most days. Pad use: none She is not bothered by her UI symptoms. Has done PT at [facility]  Day time voids 7-8.  Nocturia: 2-3 times per night to void. Voiding dysfunction: she empties her bladder well.  does not use a catheter to empty bladder.  When urinating, she feels she has no difficulties   UTIs:  0  UTI's in the last year.   Denies history of blood in urine and kidney or bladder stones  Pelvic Organ Prolapse Symptoms:                  She Admits to a feeling of a bulge the vaginal area. It has been present for years. She Denies seeing a bulge.  This bulge is bothersome.  Bowel Symptom: Bowel movements: a few times per week  Stool consistency: soft Straining: yes.  Splinting: yes.  Incomplete evacuation: yes.  She Admits to accidental bowel leakage / fecal incontinence Bowel regimen:  activated YOU, miralax at night, occasionally metamucil Has hemorrhoidal pain- tried nifedipine cream but caused burning. Using recticare and zinc oxide Last colonoscopy: Date 2021, Results- some polyps, follow up 7 years  Sexual Function Sexually active: yes.  Pain with sex: Yes, has discomfort due to dryness  Pelvic Pain Denies pelvic pain  - For atrophy, tried Glyde for sex but it was too  slippery. Has been using vitamin E, but oozes too much and feels moist. Uses it before intercourse.  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    Elevated LDL cholesterol level    Menorrhagia    Obesity 12/06   Vitreous detachment of right eye      Past Surgical History:   Past Surgical History:  Procedure Laterality Date   ABDOMINAL HYSTERECTOMY  12/22/08   LAVH/BSO   BREAST CYST ASPIRATION Right    DILATION AND CURETTAGE OF UTERUS     Lipoma removal  02/2010     Past OB/GYN History: G3 P3  Vaginal deliveries: 2,  Forceps/ Vacuum deliveries: 1, Cesarean section: 0 S/p hysterectomy for fibroids   Medications: She has a current medication list which includes the following prescription(s): vitamin d-3, hydrocortisone, onetouch delica plus [id number], latanoprost, lidocaine (anorectal), metformin, NONFORMULARY OR COMPOUNDED ITEM, one touch ultra test, polyethylene glycol 3350, pravastatin, probiotic product, and valacyclovir.   Allergies: Patient is allergic to ciprofloxacin and other.   Social History:  Social History   Tobacco Use   Smoking status: Never   Smokeless tobacco: Never  Vaping Use   Vaping Use: Never used  Substance Use Topics   Alcohol use: Yes    Alcohol/week: 1.0 - 2.0 standard drink    Types: 1 - 2 Standard  drinks or equivalent per week   Drug use: No    Family History:   Family History  Problem Relation Age of Onset   Asthma Mother    Emphysema Father    Cancer Sister        bladder cancer   Emphysema Sister    Diabetes Sister    Cancer Brother        lung cancer   Breast cancer Daughter 74       Stage 2, triple positive     Review of Systems: Review of Systems  Constitutional:  Negative for fever, malaise/fatigue and weight loss.  Respiratory:  Negative for cough, shortness of breath and  wheezing.   Cardiovascular:  Negative for chest pain, palpitations and leg swelling.  Gastrointestinal:  Positive for blood in stool. Negative for abdominal pain.  Genitourinary:  Negative for dysuria.  Musculoskeletal:  Negative for myalgias.  Skin:  Negative for rash.  Neurological:  Negative for dizziness and headaches.  Endo/Heme/Allergies:  Does not bruise/bleed easily.  Psychiatric/Behavioral:  Negative for depression. The patient is not nervous/anxious.     OBJECTIVE Physical Exam: Vitals:   04/13/21 1427  BP: (!) 145/82  Pulse: 81  Weight: 194 lb (88 kg)    Physical Exam Constitutional:      General: She is not in acute distress. Pulmonary:     Effort: Pulmonary effort is normal.  Abdominal:     General: There is no distension.     Palpations: Abdomen is soft.     Tenderness: There is no abdominal tenderness. There is no rebound.  Musculoskeletal:        General: No swelling. Normal range of motion.  Skin:    General: Skin is warm and dry.     Findings: No rash.  Neurological:     Mental Status: She is alert and oriented to person, place, and time.  Psychiatric:        Mood and Affect: Mood normal.        Behavior: Behavior normal.     GU / Detailed Urogynecologic Evaluation:  Pelvic Exam: Normal external female genitalia; Bartholin's and Skene's glands normal in appearance; urethral meatus normal in appearance, no urethral masses or discharge.   CST: negative  s/p hysterectomy: Speculum exam reveals normal vaginal mucosa with  atrophy and normal vaginal cuff.  Adnexa no mass, fullness, tenderness.    Pelvic floor strength III/V, puborectalis III/V external anal sphincter IV/V  Pelvic floor musculature: Right levator tender, Right obturator tender, Left levator tender, Left obturator tender  POP-Q:   POP-Q  -0.5                                            Aa   -0.5                                           Ba  -8                                               C   3  Gh  6                                            Pb  9                                            tvl   -1                                            Ap  -1                                            Bp                                                 D     Rectal Exam:  Large hemorrhoids. Normal sphincter tone, small distal rectocele, enterocoele not present, no rectal masses  Post-Void Residual (PVR) by Bladder Scan: In order to evaluate bladder emptying, we discussed obtaining a postvoid residual and she agreed to this procedure.  Procedure: The ultrasound unit was placed on the patient's abdomen in the suprapubic region after the patient had voided. A PVR of 55 ml was obtained by bladder scan.  Laboratory Results: POC urine: negative   ASSESSMENT AND PLAN Ms. Soll is a 65 y.o. with:  1. Prolapse of posterior vaginal wall   2. Prolapse of anterior vaginal wall   3. Urinary urgency   4. Urinary frequency   5. Levator spasm   6. Vaginal atrophy   7. Hemorrhoids, unspecified hemorrhoid type    Stage II anterior, Stage II posterior, Stage I apical prolapse -For treatment of pelvic organ prolapse, we discussed options for management including expectant management, conservative management, and surgical management, such as Kegels, a pessary, pelvic floor physical therapy, and specific surgical procedures. - She is potentially interested in surgery (anterior and posterior repair). Handouts provided and she will consider her options and notify our office of her decision.  - Would need urodynamic testing prior to scheduling surgery due to mixed incontinence   2. We discussed the symptoms of overactive bladder (OAB), which include urinary urgency, urinary frequency, nocturia, with or without urge incontinence.  While we do not know the exact etiology of OAB, several treatment options exist. We discussed management  including behavioral therapy (decreasing bladder irritants, urge suppression strategies, timed voids, bladder retraining), physical therapy, medication. - She would like to start with pelvic PT- referral placed  3. Levator spasm The origin of pelvic floor muscle spasm can be multifactorial, including primary, reactive to a different pain source, trauma, or even part of a centralized pain syndrome.Treatment options include pelvic floor physical therapy, local (vaginal) or oral  muscle relaxants, pelvic muscle trigger point injections or centrally acting pain medications.   - She will work with pelvic PT tot help with pain management  4. Vaginal atrophy - discussed adding  coconut oil or vitamin E cream (instead of suppository)  5. Hemorrhoids - interested in definitive management, referral made to colorectal surgery  Jaquita Folds, MD   Medical Decision Making:  - Reviewed/ ordered a clinical laboratory test - Independent review of image, tracing or specimen

## 2021-04-13 ENCOUNTER — Ambulatory Visit (INDEPENDENT_AMBULATORY_CARE_PROVIDER_SITE_OTHER): Payer: Medicare Other | Admitting: Obstetrics and Gynecology

## 2021-04-13 ENCOUNTER — Other Ambulatory Visit: Payer: Self-pay

## 2021-04-13 ENCOUNTER — Encounter: Payer: Self-pay | Admitting: Obstetrics and Gynecology

## 2021-04-13 VITALS — BP 145/82 | HR 81 | Wt 194.0 lb

## 2021-04-13 DIAGNOSIS — N8110 Cystocele, unspecified: Secondary | ICD-10-CM | POA: Diagnosis not present

## 2021-04-13 DIAGNOSIS — N952 Postmenopausal atrophic vaginitis: Secondary | ICD-10-CM

## 2021-04-13 DIAGNOSIS — R3915 Urgency of urination: Secondary | ICD-10-CM

## 2021-04-13 DIAGNOSIS — R350 Frequency of micturition: Secondary | ICD-10-CM

## 2021-04-13 DIAGNOSIS — K649 Unspecified hemorrhoids: Secondary | ICD-10-CM

## 2021-04-13 DIAGNOSIS — M62838 Other muscle spasm: Secondary | ICD-10-CM

## 2021-04-13 DIAGNOSIS — N816 Rectocele: Secondary | ICD-10-CM | POA: Diagnosis not present

## 2021-04-13 NOTE — Patient Instructions (Signed)
Vulvovaginal moisturizer Options: Vitamin E oil (pump or capsule) or cream (Gene's Vit E Cream) Coconut oil V-magic Silicone-based lubricant for use during intercourse ("wet platinum" is a brand available at most drugstores) Crisco Consider the ingredients of the product - the fewer the ingredients the better!  Directions for Use: Clean and dry your hands Gently dab the vulvar/vaginal area dry as needed Apply a "pea-sized" amount of the moisturizer onto your fingertip Using you other hand, open the labia  Apply the moisturizer to the vulvar/vaginal tissues Wear loose fitting underwear/clothing if possible following application Use moisturize up to 3 times daily as desired.

## 2021-04-14 ENCOUNTER — Encounter: Payer: Self-pay | Admitting: Obstetrics and Gynecology

## 2021-04-14 LAB — POCT URINALYSIS DIPSTICK
Appearance: NORMAL
Bilirubin, UA: NEGATIVE
Blood, UA: NEGATIVE
Glucose, UA: NEGATIVE
Ketones, UA: NEGATIVE
Leukocytes, UA: NEGATIVE
Nitrite, UA: NEGATIVE
Protein, UA: NEGATIVE
Spec Grav, UA: 1.015 (ref 1.010–1.025)
Urobilinogen, UA: 0.2 E.U./dL
pH, UA: 5 (ref 5.0–8.0)

## 2021-05-13 ENCOUNTER — Encounter: Payer: Self-pay | Admitting: Physical Therapy

## 2021-05-13 ENCOUNTER — Ambulatory Visit: Payer: Medicare Other | Attending: Obstetrics and Gynecology | Admitting: Physical Therapy

## 2021-05-13 ENCOUNTER — Other Ambulatory Visit: Payer: Self-pay

## 2021-05-13 DIAGNOSIS — M6281 Muscle weakness (generalized): Secondary | ICD-10-CM | POA: Insufficient documentation

## 2021-05-13 DIAGNOSIS — R279 Unspecified lack of coordination: Secondary | ICD-10-CM | POA: Diagnosis present

## 2021-05-13 NOTE — Therapy (Signed)
[facility] [address], [place], [place], [address] Phone: [phone]   Fax:  [phone]  Physical Therapy Evaluation  Patient Details  Name: Carolyn Rollins MRN: [id number] Date of Birth: October 15, 1955 Referring Provider (PT): Jaquita Folds, MD   Encounter Date: 05/13/2021   PT End of Session - 05/13/21 1145     Visit Number 1    Date for PT Re-Evaluation 08/05/21    Authorization Type medicare A/B    PT Start Time 0927    PT Stop Time 1012    PT Time Calculation (min) 45 min    Activity Tolerance Patient tolerated treatment well    Behavior During Therapy [facility] for tasks assessed/performed             Past Medical History:  Diagnosis Date   Diabetes ([place]) 2013   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    Subjective Assessment - 05/13/21 0930     Subjective I just feel like I could empty my bladder all the time.  When I get home from somewhere like the store I have to go immediately to the bathroom.  I feel moist all the time.  I was feeling very dry and irritated and constipated.  I got terrible hemorroids and incomplete BMs.  I was taking sitz baths 3 times per day and increased fiber.  I have leakage sometimes if I try to put the groceries away.  I drink a tone of water, probaby 100cc.    Patient Stated Goals stop the urgency and leakage    Currently in Pain? No/denies                [facility] PT Assessment - 05/13/21 0001       Assessment   Medical Diagnosis N81.6 (ICD-10-CM) - Prolapse of posterior vaginal wall;N81.10  (ICD-10-CM) - Prolapse of anterior vaginal wall;R39.15 (ICD-10-CM) - Urinary urgency;M62.838 (ICD-10-CM) - Levator spasm    Referring Provider (PT) Jaquita Folds, MD    Prior Therapy No      Precautions   Precautions None      Balance Screen   Has the patient fallen in the past 6 months No      [place] residence    Living Arrangements Spouse/significant other      Prior Function   Level of [place] Retired    Leisure walking      Cognition   Overall Cognitive Status Within Functional Limits for tasks assessed      Posture/Postural Control   Posture/Postural Control No significant limitations      ROM / Strength   AROM / PROM / Strength --   unable due to history running over time                       Objective measurements completed on examination: See above findings.     Pelvic Floor Special Questions - 05/13/21 0001  Currently Sexually Active Yes    Is this Painful Yes   dryness   Urinary Leakage Yes    How often daily    Pad use no4    Activities that cause leaking With strong urge   a little throughout the day (wetness)   Urinary urgency Yes    Urinary frequency yes drinking a lot of water    Fecal incontinence No    Fluid intake 100 oz    Falling out feeling (prolapse) No    Skin Integrity --   pale around upper indroitus   Prolapse Posterior Wall    Pelvic Floor Internal Exam pt identity confirmed and internal soft tissue assessed    Exam Type Vaginal    Palpation tight Lt>Rt levators; tight vaginal wall with taught bands Lt>Rt    Strength weak squeeze, no lift    Strength # of reps --   6 in 10 sec   Strength # of seconds 3    Tone higher than normal                        PT Short Term Goals - 05/13/21 1143       PT SHORT TERM GOAL #1   Title ind with moisturizing    Time 4    Period Weeks    Status New    Target Date 06/10/21                PT Long Term Goals - 05/13/21 1143       PT LONG TERM GOAL #1   Title ind with advanced HEP    Time 12    Period Weeks    Status New    Target Date 08/05/21      PT LONG TERM GOAL #2   Title report at least 75% less urgency    Time 12    Period Weeks    Status New    Target Date 08/05/21      PT LONG TERM GOAL #3   Title Report 75% less straining for a BM    Time 12    Period Weeks    Status New    Target Date 08/05/21      PT LONG TERM GOAL #4   Title Pt will report no wetness throughout the day    Time 12    Period Weeks    Status New    Target Date 08/05/21                    Plan - 05/13/21 1121     Clinical Impression Statement Pt presents to skilled PT due to bladder and toileting issues that have recently gotten worse.  Pt is familiar with pelvic floor therapy and has done in the past.  Recently she had a bout of extreme dryness and constipation.  Pt has fascial restrictions throughout pelvic floor and tight levators Lt>Rt.  pt has 2/5MMT of pelvic floor and holding for 3 seconds at most. Pt was able to do quick flicks 6 in 10 seconds.  Pt having lower strength and endurance in conjunction with tight and restricted muscles is likely contributing to her current symptoms.  She also demonstrates some thoracic kyphosis and rounding of shoulders placing assymetrical loading on the pelvic floor throughout the trunk. Pt will benefit from skilled PT to address impairments and restore full function and quality of life.    Personal Factors and Comorbidities  Comorbidity 2    Comorbidities menopaus, hysterectomy    Examination-Activity Limitations Continence;Toileting    Examination-Participation Restrictions Community Activity    Stability/Clinical Decision Making Evolving/Moderate complexity    Clinical Decision Making Low    Rehab Potential Excellent    PT Frequency 1x / week    PT Duration 12 weeks    PT Treatment/Interventions ADLs/Self Care Home  Management;Biofeedback;Cryotherapy;Electrical Stimulation;Moist Heat;Therapeutic activities;Therapeutic exercise;Patient/family education;Neuromuscular re-education;Manual techniques;Passive range of motion;Dry needling;Taping    PT Next Visit Plan f/u on moisturizing and working on [employer] to levators, breathing and bulge with contract and hold on exhale    PT Home Exercise Plan self massage    Consulted and Agree with Plan of Care Patient             Patient will benefit from skilled therapeutic intervention in order to improve the following deficits and impairments:  Pain, Decreased strength, Decreased endurance, Decreased coordination  Visit Diagnosis: Muscle weakness (generalized)  Unspecified lack of coordination     Problem List Patient Active Problem List   Diagnosis Date Noted   Pure hypercholesterolemia 12/10/2020   Vitamin D deficiency 12/10/2020   Type 2 diabetes mellitus ([place]) 09/15/2014    Camillo Flaming Brant Peets, PT 05/13/2021, 11:51 AM  [facility] [address], [place], [place], [address] Phone: [phone]   Fax:  [phone]  Name: Carolyn Rollins MRN: [id number] Date of Birth: [birth year]

## 2021-06-09 ENCOUNTER — Encounter: Payer: Self-pay | Admitting: Physical Therapy

## 2021-06-09 ENCOUNTER — Other Ambulatory Visit: Payer: Self-pay

## 2021-06-09 ENCOUNTER — Ambulatory Visit: Payer: Medicare Other | Admitting: Physical Therapy

## 2021-06-09 DIAGNOSIS — R279 Unspecified lack of coordination: Secondary | ICD-10-CM

## 2021-06-09 DIAGNOSIS — M6281 Muscle weakness (generalized): Secondary | ICD-10-CM

## 2021-06-09 NOTE — Patient Instructions (Addendum)
   Access Code: [email or web address] URL: [email or web address] Date: 06/09/2021 Prepared by: Jari Favre  Exercises Supine Pelvic Floor Stretch - Hands on Knees - 1 x daily - 7 x weekly - 1 sets - 3 reps - 30 hold Supine Single Knee to Chest Stretch - 2 x daily - 7 x weekly - 1 sets - 5 reps - 10 sec hold

## 2021-06-09 NOTE — Therapy (Signed)
[facility] [address] [place], [place], [address] Phone: [phone]   Fax:  [phone]  Physical Therapy Treatment  Patient Details  Name: Carolyn Rollins MRN: [id number] Date of Birth: April 22, 1956 Referring Provider (PT): Jaquita Folds, MD   Encounter Date: 06/09/2021   PT End of Session - 06/09/21 1453     Visit Number 2    Date for PT Re-Evaluation 08/05/21    Authorization Type medicare A/B    PT Start Time 4627    PT Stop Time 1526    PT Time Calculation (min) 38 min    Activity Tolerance Patient tolerated treatment well    Behavior During Therapy [facility] for tasks assessed/performed             Past Medical History:  Diagnosis Date   Diabetes ([place]) 2013   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   Subjective Assessment - 06/09/21 1449     Subjective My bladder is always full more than 8 seconds and going a normal amount.  I used the coconut oil and that seemed to help. I have been using a probiotic and that seems like it helped the constipation and going almost every day.    Patient Stated Goals stop the urgency and leakage    Currently in Pain? No/denies                               [facility] Adult PT Treatment/Exercise - 06/09/21 0001       Self-Care   Self-Care Other Self-Care Comments    Other Self-Care Comments  compression band info      Exercises   Exercises Lumbar      Lumbar Exercises: Stretches   Passive Hamstring Stretch 2 reps;30 seconds    Single Knee to Chest Stretch 3 reps;30  seconds    Double Knee to Chest Stretch Limitations happy baby stretch      Manual Therapy   Manual Therapy Internal Pelvic Floor    Manual therapy comments pt identity confirmed and informed consent    Internal Pelvic Floor rt levator release mid-posterior static hold release                     PT Education - 06/09/21 1530     Education Details Access Code: [email or web address]    Person(s) Educated Patient    Methods Explanation;Demonstration;Verbal cues;Tactile cues;Handout    Comprehension Verbalized understanding;Returned demonstration              PT Short Term Goals - 06/09/21 1504       PT SHORT TERM GOAL #1   Title ind with moisturizing    Status Achieved               PT Long Term Goals - 06/09/21 1521       PT LONG TERM GOAL #1   Title ind with advanced HEP    Status On-going  PT LONG TERM GOAL #2   Title report at least 75% less urgency    Baseline that is better, 50%    Status On-going      PT LONG TERM GOAL #3   Title Report 75% less straining for a BM    Status On-going      PT LONG TERM GOAL #4   Title Pt will report no wetness throughout the day    Baseline happens 2x/day but just a little drop    Status On-going                   Plan - 06/09/21 1504     Clinical Impression Statement Pt ind with moisturizing and feeling better overall with improved BMs.  Pt is still having small amounts of leakage that she is not sure why.  Pt had tension in levators on the Rt side.  Release achieved with static hold. Pt was educated on self stretch and ways to work on penetration for reduced pain with intercourse.  Pt was also given stretches to continue to work on pelvic floor lengthening    PT Treatment/Interventions ADLs/Self Care Home Management;Biofeedback;Cryotherapy;Electrical Stimulation;Moist Heat;Therapeutic activities;Therapeutic exercise;Patient/family education;Neuromuscular re-education;Manual techniques;Passive range of  motion;Dry needling;Taping    PT Next Visit Plan f/u on stretches from HEP,             Patient will benefit from skilled therapeutic intervention in order to improve the following deficits and impairments:  Pain, Decreased strength, Decreased endurance, Decreased coordination  Visit Diagnosis: Muscle weakness (generalized)  Unspecified lack of coordination     Problem List Patient Active Problem List   Diagnosis Date Noted   Pure hypercholesterolemia 12/10/2020   Vitamin D deficiency 12/10/2020   Type 2 diabetes mellitus ([place]) 09/15/2014    Jule Ser, PT 06/09/2021, 5:39 PM  [facility] [address], [place], [place], [address] Phone: [phone]   Fax:  [phone]  Name: Carolyn Rollins MRN: [id number] Date of Birth: Oct 04, 1955

## 2021-07-08 ENCOUNTER — Encounter: Payer: Medicare Other | Admitting: Physical Therapy

## 2021-07-15 ENCOUNTER — Encounter: Payer: Self-pay | Admitting: Physical Therapy

## 2021-07-15 ENCOUNTER — Other Ambulatory Visit: Payer: Self-pay

## 2021-07-15 ENCOUNTER — Ambulatory Visit: Payer: Medicare Other | Attending: Obstetrics and Gynecology | Admitting: Physical Therapy

## 2021-07-15 DIAGNOSIS — M6281 Muscle weakness (generalized): Secondary | ICD-10-CM | POA: Diagnosis present

## 2021-07-15 DIAGNOSIS — R279 Unspecified lack of coordination: Secondary | ICD-10-CM | POA: Diagnosis present
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
## 2021-07-15 NOTE — Patient Instructions (Signed)
Access Code: [email or web address] URL: [email or web address] Date: 07/15/2021 Prepared by: Jari Favre  Exercises Supine Pelvic Floor Stretch - Hands on Knees - 1 x daily - 7 x weekly - 1 sets - 3 reps - 30 hold Supine Single Knee to Chest Stretch - 2 x daily - 7 x weekly - 1 sets - 5 reps - 10 sec hold Sit to Stand with Pelvic Floor Contraction - 3 x daily - 7 x weekly - 10 reps - 1 sets Standing Shoulder Flexion to 90 Degrees - 1 x daily - 7 x weekly - 3 sets - 10 reps Staggered Stance Biceps Curl - 1 x daily - 7 x weekly - 3 sets - 10 reps

## 2021-07-18 NOTE — Progress Notes (Signed)
[facility] Return Visit  SUBJECTIVE  History of Present Illness: Carolyn Rollins is a 65 y.o. female seen in follow-up for prolapse, OAB and levator spasm. Plan at last visit was to attend physical therapy- she has done 3 sessions.   Has been doing the exercises at home with physical therapy. Feels this helps with leakage and bladder control. Feels that she wants to hold off on surgery at this time.   Has been to GI and did sitz baths and miralax, and this helped with her hemorrhoids. Did not feel she needed to see colorectal. Now doing colace twice a day and miralax once a day.   Pain in vaginal area has improved. Started using coconut oil and it has helped a lot with the dryness.   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   Past Surgical History: She  has a past surgical history that includes Abdominal hysterectomy (12/22/08); Dilation and curettage of uterus; Lipoma removal (02/2010); and Breast cyst aspiration (Right).   Medications: She has a current medication list which includes the following prescription(s): vitamin d-3, hydrocortisone, onetouch delica plus [id number], latanoprost, lidocaine (anorectal), metformin, NONFORMULARY OR COMPOUNDED ITEM, one touch ultra test, polyethylene glycol 3350, pravastatin, probiotic product, and valacyclovir.   Allergies: Patient is allergic to ciprofloxacin and other.   Social History: Patient  reports that she has never smoked. She has never used smokeless tobacco. She reports current alcohol use of about 1.0 - 2.0 standard drink per week. She reports that she does not use drugs.      OBJECTIVE     Physical Exam: Vitals:   07/20/21 1124  BP: (!) 151/80  Pulse: 61  Weight: 194 lb (88 kg)  Height: 5\' 3"  (1.6 m)   Gen:  No apparent distress, A&O x 3.  Detailed Urogynecologic Evaluation:  Deferred.    ASSESSMENT AND PLAN    [name] is a 65 y.o. with:  1. Prolapse of posterior vaginal wall   2. Prolapse of anterior vaginal wall   3. Urinary urgency   4. Vaginal atrophy    - symptoms have improved with physical therapy and conservative management - discussed options for prolapse including expectant management, pessary or surgery. She would like expectant management at this time.   Return as needed.   Jaquita Folds, MD   Time spent: I spent 20 minutes dedicated to the care of this patient on the date of this encounter to include pre-visit review of records, face-to-face time with the patient  and post visit documentation.

## 2021-07-20 ENCOUNTER — Ambulatory Visit (INDEPENDENT_AMBULATORY_CARE_PROVIDER_SITE_OTHER): Payer: Medicare Other | Admitting: Obstetrics and Gynecology

## 2021-07-20 ENCOUNTER — Encounter: Payer: Self-pay | Admitting: Obstetrics and Gynecology

## 2021-07-20 ENCOUNTER — Other Ambulatory Visit: Payer: Self-pay

## 2021-07-20 VITALS — BP 151/80 | HR 61 | Ht 63.0 in | Wt 194.0 lb

## 2021-07-20 DIAGNOSIS — R3915 Urgency of urination: Secondary | ICD-10-CM

## 2021-07-20 DIAGNOSIS — N952 Postmenopausal atrophic vaginitis: Secondary | ICD-10-CM | POA: Diagnosis not present

## 2021-07-20 DIAGNOSIS — N8110 Cystocele, unspecified: Secondary | ICD-10-CM | POA: Diagnosis not present

## 2021-07-20 DIAGNOSIS — N816 Rectocele: Secondary | ICD-10-CM

## 2021-07-22 ENCOUNTER — Encounter: Payer: Self-pay | Admitting: Physical Therapy

## 2021-07-22 ENCOUNTER — Ambulatory Visit: Payer: Medicare Other | Admitting: Physical Therapy

## 2021-07-22 ENCOUNTER — Other Ambulatory Visit: Payer: Self-pay

## 2021-07-22 DIAGNOSIS — M6281 Muscle weakness (generalized): Secondary | ICD-10-CM | POA: Diagnosis not present

## 2021-07-22 DIAGNOSIS — R279 Unspecified lack of coordination: Secondary | ICD-10-CM

## 2021-07-22 NOTE — Therapy (Signed)
[facility] @ [place], [place], [address] Phone: [phone]   Fax:  [phone]  Physical Therapy Treatment  Patient Details  Name: Carolyn Rollins MRN: [id number] Date of Birth: Aug 04, 1956 Referring Provider (PT): Jaquita Folds, MD   Encounter Date: 07/22/2021   PT End of Session - 07/22/21 1020     Visit Number 4    Date for PT Re-Evaluation 08/05/21    Authorization Type medicare A/B    PT Start Time 0932    PT Stop Time 1013    PT Time Calculation (min) 41 min    Activity Tolerance Patient tolerated treatment well    Behavior During Therapy [facility] for tasks assessed/performed             Past Medical History:  Diagnosis Date   Diabetes ([place]) 2013   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   Subjective Assessment - 07/22/21 1039     Subjective Reviewed bladder/bowel journal.  Urge with water running and target.  a little constipation caused more frequency.    Patient Stated Goals stop the urgency and leakage    Currently in Pain? No/denies                               [facility] Adult PT Treatment/Exercise - 07/22/21 0001       Self-Care   Other Self-Care Comments  reivewed bladder journal and discussed kegel amount, ways to work on urge      Lumbar Exercises: Stretches   Double Knee to Chest Stretch Limitations happy baby stretch    Other Lumbar Stretch Exercise trunk rotation      Lumbar Exercises: Standing   Other Standing Lumbar Exercises lean on talble toes neutral, toes in - 20x      Lumbar  Exercises: Supine   Bent Knee Raise 20 reps    Dead Bug 20 reps                     PT Education - 07/22/21 1009     Education Details Access Code: [email or web address]    Person(s) Educated Patient    Methods Demonstration;Explanation;Tactile cues;Verbal cues;Handout    Comprehension Verbalized understanding;Returned demonstration              PT Short Term Goals - 06/09/21 1504       PT SHORT TERM GOAL #1   Title ind with moisturizing    Status Achieved               PT Long Term Goals - 07/22/21 0946       PT LONG TERM GOAL #1   Title ind with advanced HEP    Status On-going      PT LONG TERM GOAL #2   Title report at least 75% less urgency    Baseline that is better, 50%    Status On-going      PT LONG TERM GOAL #  3   Title Report 75% less straining for a BM    Baseline I have had more      PT LONG TERM GOAL #4   Title Pt will report no wetness throughout the day    Status On-going                   Plan - 07/22/21 1009     Clinical Impression Statement Pt brought bladder journal and noticed water stimulates more urge.  Pt has been having more constipation this week so reviewed the appropriate amount of kegels to do throughout the day. Pt was also able to identify exercises where she felt the muscle contracting more in the anterior pelvic floor and did stretches and breathing into posterior pelvic floor.    PT Treatment/Interventions ADLs/Self Care Home Management;Biofeedback;Cryotherapy;Electrical Stimulation;Moist Heat;Therapeutic activities;Therapeutic exercise;Patient/family education;Neuromuscular re-education;Manual techniques;Passive range of motion;Dry needling;Taping    PT Next Visit Plan f/u on constipation and if leaning fwd kegel's helped, cont core strength progression    PT Home Exercise Plan self massage    Consulted and Agree with Plan of Care Patient             Patient will benefit from skilled therapeutic intervention  in order to improve the following deficits and impairments:  Pain, Decreased strength, Decreased endurance, Decreased coordination  Visit Diagnosis: Muscle weakness (generalized)  Unspecified lack of coordination     Problem List Patient Active Problem List   Diagnosis Date Noted   Pure hypercholesterolemia 12/10/2020   Vitamin D deficiency 12/10/2020   Type 2 diabetes mellitus ([place]) 09/15/2014    Jule Ser, PT 07/22/2021, 11:47 AM  [facility] @ [place], [place], [address] Phone: [phone]   Fax:  [phone]  Name: Carolyn Rollins MRN: [id number] Date of Birth: 1955-11-02

## 2021-07-22 NOTE — Patient Instructions (Signed)
Access Code: [email or web address] URL: [email or web address] Date: 07/22/2021 Prepared by: Jari Favre  Exercises Supine Pelvic Floor Stretch - Hands on Knees - 1 x daily - 7 x weekly - 1 sets - 3 reps - 30 hold Supine Single Knee to Chest Stretch - 2 x daily - 7 x weekly - 1 sets - 5 reps - 10 sec hold Sit to Stand with Pelvic Floor Contraction - 3 x daily - 7 x weekly - 1 sets - 10 reps Staggered Stance Biceps Curl - 1 x daily - 7 x weekly - 3 sets - 10 reps Table Lean - 3 x daily - 7 x weekly - 1 sets - 10 reps Dead Bug - 1 x daily - 7 x weekly - 3 sets - 10 reps Supine Lower Trunk Rotation - 1 x daily - 7 x weekly - 1 sets - 10 reps - 5 sec hold Supine Diaphragmatic Breathing - 3 x daily - 7 x weekly - 10 reps - 1 sets

## 2021-07-26 ENCOUNTER — Other Ambulatory Visit: Payer: Self-pay

## 2021-07-26 ENCOUNTER — Ambulatory Visit: Payer: Medicare Other | Admitting: Physical Therapy

## 2021-07-26 DIAGNOSIS — M6281 Muscle weakness (generalized): Secondary | ICD-10-CM

## 2021-07-26 DIAGNOSIS — R279 Unspecified lack of coordination: Secondary | ICD-10-CM

## 2021-07-26 NOTE — Patient Instructions (Signed)
Access Code: [email or web address] URL: [email or web address] Date: 07/26/2021 Prepared by: Jari Favre  Exercises Supine Pelvic Floor Stretch - Hands on Knees - 1 x daily - 7 x weekly - 1 sets - 3 reps - 30 hold Supine Single Knee to Chest Stretch - 2 x daily - 7 x weekly - 1 sets - 5 reps - 10 sec hold Sit to Stand with Pelvic Floor Contraction - 3 x daily - 7 x weekly - 1 sets - 10 reps Staggered Stance Biceps Curl - 1 x daily - 7 x weekly - 3 sets - 10 reps Table Lean - 3 x daily - 7 x weekly - 1 sets - 10 reps Dead Bug - 1 x daily - 7 x weekly - 3 sets - 10 reps Supine Lower Trunk Rotation - 1 x daily - 7 x weekly - 1 sets - 10 reps - 5 sec hold Supine Diaphragmatic Breathing - 3 x daily - 7 x weekly - 1 sets - 10 reps Standing Pelvic Floor Contraction - 1 x daily - 7 x weekly - 3 sets - 10 reps Squat - 2 x daily - 7 x weekly - 1 sets - 5 reps

## 2021-07-26 NOTE — Therapy (Signed)
[facility] @ [place], [place], [address] Phone: [phone]   Fax:  [phone]  Physical Therapy Treatment  Patient Details  Name: Carolyn Rollins MRN: [id number] Date of Birth: 1955/12/05 Referring Provider (PT): Jaquita Folds, MD   Encounter Date: 07/26/2021   PT End of Session - 07/26/21 1318     Visit Number 5    Date for PT Re-Evaluation 08/05/21    Authorization Type medicare A/B    PT Start Time 1147    PT Stop Time 1226    PT Time Calculation (min) 39 min    Activity Tolerance Patient tolerated treatment well    Behavior During Therapy [facility] for tasks assessed/performed             Past Medical History:  Diagnosis Date   Diabetes ([place]) 2013   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   Subjective Assessment - 07/26/21 1151     Subjective I have worked onthe kegels standing.  The holding is not as long so I start and stop after standing and sitting.  I am working    Patient Stated Goals stop the urgency and leakage                               [facility] Adult PT Treatment/Exercise - 07/26/21 0001       Lumbar Exercises: Standing   Functional Squats 10 reps   2 sets with kegel   Other Standing Lumbar Exercises pallof with green band - pressout; walk outs yellow band; shoulder ext red band                     PT Education - 07/26/21 1259     Education Details Access Code: [email or web address]    Person(s) Educated Patient    Methods Explanation;Demonstration;Tactile cues;Handout;Verbal cues     Comprehension Verbalized understanding;Returned demonstration              PT Short Term Goals - 06/09/21 1504       PT SHORT TERM GOAL #1   Title ind with moisturizing    Status Achieved               PT Long Term Goals - 07/26/21 1153       PT LONG TERM GOAL #1   Title ind with advanced HEP    Status On-going      PT LONG TERM GOAL #2   Title report at least 75% less urgency    Baseline better with using the exercises      PT LONG TERM GOAL #3   Title Report 75% less straining for a BM    Baseline It was okay this week but maybe from something I was eating, not sure if it is better    Status On-going      PT LONG TERM GOAL #4   Title Pt will report no wetness throughout the day  Baseline yes, but no leakage and I think think this is better and there are days that I don't have that feeling    Status On-going                   Plan - 07/26/21 1314     Clinical Impression Statement Pt is doing better overall.  Pt is having less wetness and had less constipation this week.  Today's session focused on core with functional movements.  She needed cues during squats for iproved technique.  Squats with correct technique fatigued the pelvic floor muscles towards the end of the session. Pt will stil benefit from skilled PT to work on pelvic floor strength and endurance    PT Treatment/Interventions ADLs/Self Care Home Management;Biofeedback;Cryotherapy;Electrical Stimulation;Moist Heat;Therapeutic activities;Therapeutic exercise;Patient/family education;Neuromuscular re-education;Manual techniques;Passive range of motion;Dry needling;Taping    PT Next Visit Plan f/u on constipation and squat, add hip hinge with kegel and cont to update HEP as needed    PT Home Exercise Plan Access Code: [email or web address]    Consulted and Agree with Plan of Care Patient             Patient will benefit from skilled therapeutic intervention in order to improve the following deficits  and impairments:  Pain, Decreased strength, Decreased endurance, Decreased coordination  Visit Diagnosis: Muscle weakness (generalized)  Unspecified lack of coordination     Problem List Patient Active Problem List   Diagnosis Date Noted   Pure hypercholesterolemia 12/10/2020   Vitamin D deficiency 12/10/2020   Type 2 diabetes mellitus ([place]) 09/15/2014    Jule Ser, PT 07/26/2021, 1:19 PM  [facility] @ [place], [place], [address] Phone: [phone]   Fax:  [phone]  Name: Carolyn Rollins MRN: [id number] Date of Birth: Aug 09, 1956

## 2021-08-01 ENCOUNTER — Ambulatory Visit: Payer: Medicare Other | Admitting: Physical Therapy

## 2021-08-01 ENCOUNTER — Encounter: Payer: Self-pay | Admitting: Physical Therapy

## 2021-08-01 ENCOUNTER — Other Ambulatory Visit: Payer: Self-pay

## 2021-08-01 DIAGNOSIS — M6281 Muscle weakness (generalized): Secondary | ICD-10-CM

## 2021-08-01 DIAGNOSIS — R279 Unspecified lack of coordination: Secondary | ICD-10-CM

## 2021-08-01 NOTE — Therapy (Signed)
[facility] @ [place] [address], [place], [address] Phone: [phone]   Fax:  [phone]  Physical Therapy Treatment  Patient Details  Name: Carolyn Rollins MRN: [id number] Date of Birth: 1956-05-01 Referring Provider (PT): Jaquita Folds, MD   Encounter Date: 08/01/2021   PT End of Session - 08/01/21 1214     Visit Number 6    Date for PT Re-Evaluation 08/05/21    Authorization Type medicare A/B    PT Start Time 1017    PT Stop Time 1058    PT Time Calculation (min) 41 min    Activity Tolerance Patient tolerated treatment well    Behavior During Therapy [facility] for tasks assessed/performed             Past Medical History:  Diagnosis Date   Diabetes ([place]) 2013   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   Subjective Assessment - 08/01/21 1023     Subjective I had one time last week when I had to use the urge techniques and it worked which was definite improvement.  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  I wasn't drinking water during that time though.  I am more aware when I drink more water that I need to tighten the muscles more.                               [facility] Adult PT Treatment/Exercise - 08/01/21 0001       Lumbar Exercises: Aerobic   Nustep L1 x 7 min - UE/LE PTpresent for status and cue to engage core      Lumbar Exercises: Standing   Functional Squats 10 reps   2 sets with kegel   Row 10 reps    Row Limitations weights 4lb    Shoulder Extension  20 reps    Theraband Level (Shoulder Extension) Level 3 (Green)    Shoulder Extension Limitations reverse with green isometric flexion    Other Standing Lumbar Exercises tactile cues with exhale to make sure doing kegel correctly- able to do with lifting weight - 3 reps then fatigues (did at end of session)    Other Standing Lumbar Exercises hip hinge with kegel educated and cued to keep back flat                       PT Short Term Goals - 06/09/21 1504       PT SHORT TERM GOAL #1   Title ind with moisturizing    Status Achieved               PT Long Term Goals - 08/01/21 1022       PT LONG TERM GOAL #1   Title ind with advanced HEP    Status Achieved      PT LONG TERM GOAL #2   Title report  at least 75% less urgency    Baseline 50-60% less urgency    Status Partially Met      PT LONG TERM GOAL #3   Title Report 75% less straining for a BM    Baseline It is condiderably less but trying to figure out how stool can be more solid    Status Partially Met      PT LONG TERM GOAL #4   Title Pt will report no wetness throughout the day    Baseline it is mostly gone and none that is leakage    Status Achieved                   Plan - 08/01/21 1032     Clinical Impression Statement Pt has met most of her goals and is independent with HEP.  did final HEP with cues for correct hip hinge and lift.  Pt will d/c with HEP today    PT Treatment/Interventions ADLs/Self Care Home Management;Biofeedback;Cryotherapy;Electrical Stimulation;Moist Heat;Therapeutic activities;Therapeutic exercise;Patient/family education;Neuromuscular re-education;Manual techniques;Passive range of motion;Dry needling;Taping    PT Next Visit Plan d/c today    PT Home Exercise Plan Access Code: [email or web address]    Consulted and Agree with Plan of Care Patient             Patient will benefit from skilled therapeutic intervention in order to improve the following deficits and  impairments:  Pain, Decreased strength, Decreased endurance, Decreased coordination  Visit Diagnosis: Muscle weakness (generalized)  Unspecified lack of coordination     Problem List Patient Active Problem List   Diagnosis Date Noted   Pure hypercholesterolemia 12/10/2020   Vitamin D deficiency 12/10/2020   Type 2 diabetes mellitus ([place]) 09/15/2014    Jule Ser, PT 08/01/2021, 12:25 PM  [facility] @ [place], [place], [address] Phone: [phone]   Fax:  [phone]  Name: Carolyn Rollins MRN: [id number] Date of Birth: 1956/09/08   PHYSICAL THERAPY DISCHARGE SUMMARY  Visits from Start of Care: 6  Current functional level related to goals / functional outcomes:   See above goals Remaining deficits: See above   Education / Equipment: HEP  Patient agrees to discharge. Patient goals were partially met. Patient is being discharged due to being pleased with the current functional level.  Gustavus Bryant, PT 08/01/21 12:26 PM

## 2021-08-01 NOTE — Patient Instructions (Signed)

## 2022-01-30 ENCOUNTER — Ambulatory Visit (INDEPENDENT_AMBULATORY_CARE_PROVIDER_SITE_OTHER): Payer: Medicare Other | Admitting: Obstetrics & Gynecology

## 2022-01-30 ENCOUNTER — Encounter (HOSPITAL_BASED_OUTPATIENT_CLINIC_OR_DEPARTMENT_OTHER): Payer: Self-pay | Admitting: Obstetrics & Gynecology

## 2022-01-30 VITALS — BP 121/74 | HR 72 | Ht 63.5 in | Wt 200.4 lb

## 2022-01-30 DIAGNOSIS — K649 Unspecified hemorrhoids: Secondary | ICD-10-CM

## 2022-01-30 DIAGNOSIS — Z01411 Encounter for gynecological examination (general) (routine) with abnormal findings: Secondary | ICD-10-CM | POA: Diagnosis not present

## 2022-01-30 DIAGNOSIS — N816 Rectocele: Secondary | ICD-10-CM | POA: Diagnosis not present

## 2022-01-30 DIAGNOSIS — Z90710 Acquired absence of both cervix and uterus: Secondary | ICD-10-CM

## 2022-01-30 DIAGNOSIS — Z780 Asymptomatic menopausal state: Secondary | ICD-10-CM

## 2022-01-30 DIAGNOSIS — E119 Type 2 diabetes mellitus without complications: Secondary | ICD-10-CM

## 2022-01-30 NOTE — Progress Notes (Unsigned)
66 y.o. G3P2 Married White or Caucasian female here for breast and pelvic exam.  I am also following her for rectocele.  Reports having issues with hemorrhoids last year.  Used Sitz baths, fiber, miralax and she finally had some improvement with this.  Started back with PT for help with pelvic floor and OAB.  She reports she is again having issues with hemorrhoids.  She's back doing Sitz baths, fiber and miralax.  Using anusol suppositories.  Just can't seem to get any relief.  Not sure what to do.    Daughter doing well from breast cancer standpoint.  Genetic testing was negative.    Denies vaginal bleeding.  She is using vaginal vit E and coconut oil.  This help dryness but finds coconut oil feels too "oily" at times.    Patient's last menstrual period was 09/11/2008.          Sexually active: Yes.    H/O STD:  no  Health Maintenance: PCP:  Dr. Lindell Noe.  Last wellness appt was 09/2021.  Did blood work at that appt: yes Vaccines are up to date:  shingles vaccine discussed Colonoscopy:  02/2020 MMG:  02/2021 BMD:  01/2018 Last pap smear:  hysterectomy.   H/o abnormal pap smear:  no    reports that she has never smoked. She has never used smokeless tobacco. She reports current alcohol use of about 1.0 - 2.0 standard drink per week. She reports that she does not use drugs.  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     Lancets (ONETOUCH DELICA PLUS [id number])  MISC Apply 1 each topically daily.     latanoprost (XALATAN) 0.005 % ophthalmic solution Place 1 drop into both eyes at bedtime.      Lidocaine, Anorectal, 5 % CREA Apply topically.     metFORMIN (GLUCOPHAGE-XR) 500 MG 24 hr tablet Take 500 mg by mouth daily with breakfast.     NONFORMULARY OR COMPOUNDED ITEM Vitamin E vaginal suppositories 200u/ml.  One pv three times weekly. 36 each 3   ONE TOUCH ULTRA TEST test strip      Polyethylene Glycol 3350 (MIRALAX PO) Take by mouth.     pravastatin (PRAVACHOL) 10 MG tablet Take 1 tablet by mouth once a week.     Probiotic Product (PROBIOTIC DAILY PO) Take by mouth.     valACYclovir (VALTREX) 1000 MG tablet Take 2 tablets (2gms) orally and repeat in 12 hours. 30 tablet 2   hydrocortisone (ANUSOL-HC) 25 MG suppository Place 1 suppository (25 mg total) rectally 2 (two) times daily. 24 suppository 1   No current facility-administered medications for this visit.    Family History  Problem Relation Age of Onset   Asthma Mother    Emphysema Father    Cancer Sister        bladder cancer   Emphysema Sister    Diabetes  Sister    Cancer Brother        lung cancer   Breast cancer Daughter 9       Stage 2, triple positive    Review of Systems  Constitutional: Negative.   Gastrointestinal:  Positive for rectal pain.  Genitourinary: Negative.    Exam:   BP 121/74 (BP Location: Left Arm, Patient Position: Sitting, Cuff Size: Large)   Pulse 72   Ht 5' 3.5" (1.613 m) Comment: reported  Wt 200 lb 6.4 oz (90.9 kg)   LMP 09/11/2008   BMI 34.94 kg/m   Height: 5' 3.5" (161.3 cm) (reported)  General appearance: alert, cooperative and appears stated age Breasts: normal appearance, no masses or tenderness Abdomen: soft, non-tender; bowel sounds normal; no masses,  no organomegaly Lymph nodes: Cervical, supraclavicular, and axillary nodes normal.  No abnormal inguinal nodes palpated Neurologic: Grossly normal  Pelvic: External genitalia:  no  lesions              Urethra:  normal appearing urethra with no masses, tenderness or lesions              Bartholins and Skenes: normal                 Vagina: normal appearing vagina with atrophic changes and no discharge, no lesions, rectocele present              Cervix: no lesions              Pap taken: surgically absent Bimanual Exam:  Uterus:  surgically absent              Adnexa: normal adnexa and no mass, fullness, tenderness               Rectovaginal: Confirms               Anus:  normal sphincter tone, almost circumferential hemorrhoids noted, no masses, non thrombosed  Chaperone, Octaviano Batty, CMA, was present for exam.  Assessment/Plan: 1. Encntr for gyn exam (general) (routine) w abnormal findings -  pap not indicated due to hysterectomy - MMG 02/2021 - colonoscopy 02/2020 - BMD 01/2018 - labs done with Dr. Lindell Noe - vaccines reviewed/updated  2. Postmenopausal - no HRT  3. Rectocele - has seen Dr. Wannetta Sender and done PT  4. Hemorrhoids, unspecified hemorrhoid type - pt is going to look at what she has at home.  If has hydrocortisone suppositories, she is going to start using these BID.  May need new rx and she will let me know.  Also advised to let me know exactly what is in the Anusol suppository as she is sure it does not contain steroids.  This may be making her symptoms worse so will be able to give some additional advice - referral to general surgeon discussed.  Pt doesn't feel these are really bad enough for surgery so not sure.  Will consider.  5. History of LAVH/BSO  6. Type 2 diabetes mellitus without complication, without long-term current use of insulin ([place]) - managed by Dr. Lindell Noe

## 2022-01-31 ENCOUNTER — Encounter (HOSPITAL_BASED_OUTPATIENT_CLINIC_OR_DEPARTMENT_OTHER): Payer: Self-pay | Admitting: Obstetrics & Gynecology

## 2022-01-31 MED ORDER — HYDROCORTISONE ACETATE 25 MG RE SUPP: 25.0000 mg | Freq: Two times a day (BID) | RECTAL | 1 refills | Status: AC

## 2022-02-02 ENCOUNTER — Other Ambulatory Visit (HOSPITAL_BASED_OUTPATIENT_CLINIC_OR_DEPARTMENT_OTHER): Payer: Self-pay | Admitting: Obstetrics & Gynecology

## 2022-02-02 DIAGNOSIS — Z1231 Encounter for screening mammogram for malignant neoplasm of breast: Secondary | ICD-10-CM

## 2022-03-13 ENCOUNTER — Encounter (HOSPITAL_BASED_OUTPATIENT_CLINIC_OR_DEPARTMENT_OTHER): Payer: Medicare Other | Admitting: Radiology

## 2022-03-13 DIAGNOSIS — Z1231 Encounter for screening mammogram for malignant neoplasm of breast: Secondary | ICD-10-CM

## 2022-03-24 ENCOUNTER — Ambulatory Visit (HOSPITAL_BASED_OUTPATIENT_CLINIC_OR_DEPARTMENT_OTHER): Payer: Medicare Other | Admitting: Radiology

## 2022-03-31 ENCOUNTER — Ambulatory Visit (HOSPITAL_BASED_OUTPATIENT_CLINIC_OR_DEPARTMENT_OTHER)
Admission: RE | Admit: 2022-03-31 | Discharge: 2022-03-31 | Disposition: A | Discharge status: Home / Self Care | Payer: Medicare Other | Source: Ambulatory Visit | Attending: Obstetrics & Gynecology | Admitting: Obstetrics & Gynecology

## 2022-03-31 ENCOUNTER — Encounter (HOSPITAL_BASED_OUTPATIENT_CLINIC_OR_DEPARTMENT_OTHER): Payer: Self-pay | Admitting: Radiology

## 2022-03-31 DIAGNOSIS — Z1231 Encounter for screening mammogram for malignant neoplasm of breast: Secondary | ICD-10-CM

## 2022-05-03 ENCOUNTER — Telehealth: Payer: Self-pay | Admitting: Oncology

## 2022-05-03 NOTE — Telephone Encounter (Signed)
Scheduled appt per 8/23 referral. Pt is aware of appt date and time. Pt is aware to arrive 15 mins prior to appt time and to bring and updated insurance card. Pt is aware of appt location.   

## 2022-05-18 ENCOUNTER — Telehealth: Payer: Self-pay | Admitting: Physician Assistant

## 2022-05-18 NOTE — Telephone Encounter (Signed)
R/s pt's new hem appt due to pt testing positive for Covid. Pt is aware of new appt date/time.

## 2022-05-25 ENCOUNTER — Encounter: Payer: Medicare Other | Admitting: Oncology

## 2022-06-05 ENCOUNTER — Telehealth: Payer: Self-pay | Admitting: Nurse Practitioner

## 2022-06-05 NOTE — Telephone Encounter (Signed)
R/s pt's new hem appt time. Pt is aware.

## 2022-06-08 ENCOUNTER — Encounter: Payer: Medicare Other | Admitting: Physician Assistant

## 2022-06-08 ENCOUNTER — Other Ambulatory Visit: Payer: Medicare Other

## 2022-06-08 ENCOUNTER — Inpatient Hospital Stay: Payer: Medicare Other | Attending: Oncology | Admitting: Nurse Practitioner

## 2022-06-08 ENCOUNTER — Inpatient Hospital Stay: Payer: Medicare Other

## 2022-06-08 ENCOUNTER — Encounter: Payer: Self-pay | Admitting: Nurse Practitioner

## 2022-06-08 VITALS — BP 131/72 | HR 77 | Temp 97.2°F | Resp 16 | Wt 201.4 lb

## 2022-06-08 DIAGNOSIS — Z8052 Family history of malignant neoplasm of bladder: Secondary | ICD-10-CM | POA: Diagnosis not present

## 2022-06-08 DIAGNOSIS — Z79899 Other long term (current) drug therapy: Secondary | ICD-10-CM | POA: Diagnosis not present

## 2022-06-08 DIAGNOSIS — E119 Type 2 diabetes mellitus without complications: Secondary | ICD-10-CM

## 2022-06-08 DIAGNOSIS — D509 Iron deficiency anemia, unspecified: Secondary | ICD-10-CM | POA: Insufficient documentation

## 2022-06-08 DIAGNOSIS — Z803 Family history of malignant neoplasm of breast: Secondary | ICD-10-CM | POA: Diagnosis not present

## 2022-06-08 DIAGNOSIS — R5383 Other fatigue: Secondary | ICD-10-CM

## 2022-06-08 DIAGNOSIS — D508 Other iron deficiency anemias: Secondary | ICD-10-CM

## 2022-06-08 DIAGNOSIS — K625 Hemorrhage of anus and rectum: Secondary | ICD-10-CM

## 2022-06-08 DIAGNOSIS — K649 Unspecified hemorrhoids: Secondary | ICD-10-CM

## 2022-06-08 DIAGNOSIS — Z90710 Acquired absence of both cervix and uterus: Secondary | ICD-10-CM

## 2022-06-08 DIAGNOSIS — Z801 Family history of malignant neoplasm of trachea, bronchus and lung: Secondary | ICD-10-CM | POA: Diagnosis not present

## 2022-06-08 LAB — RETIC PANEL
Immature Retic Fract: 14.3 % (ref 2.3–15.9)
RBC.: 4.6 MIL/uL (ref 3.87–5.11)
Retic Count, Absolute: 70.8 10*3/uL (ref 19.0–186.0)
Retic Ct Pct: 1.5 % (ref 0.4–3.1)
Reticulocyte Hemoglobin: 31 pg (ref >27.9)

## 2022-06-08 LAB — CBC WITH DIFFERENTIAL (CANCER CENTER ONLY)
Abs Immature Granulocytes: 0.02 10*3/uL (ref 0.00–0.07)
Basophils Absolute: 0 10*3/uL (ref 0.0–0.1)
Basophils Relative: 1 %
Eosinophils Absolute: 0.1 10*3/uL (ref 0.0–0.5)
Eosinophils Relative: 1 %
HCT: 38 % (ref 36.0–46.0)
Hemoglobin: 12.3 g/dL (ref 12.0–15.0)
Immature Granulocytes: 0 %
Lymphocytes Relative: 28 %
Lymphs Abs: 1.6 10*3/uL (ref 0.7–4.0)
MCH: 26.6 pg (ref 26.0–34.0)
MCHC: 32.4 g/dL (ref 30.0–36.0)
MCV: 82.3 fL (ref 80.0–100.0)
Monocytes Absolute: 0.5 10*3/uL (ref 0.1–1.0)
Monocytes Relative: 9 %
Neutro Abs: 3.5 10*3/uL (ref 1.7–7.7)
Neutrophils Relative %: 61 %
Platelet Count: 351 10*3/uL (ref 150–400)
RBC: 4.62 MIL/uL (ref 3.87–5.11)
RDW: 21.8 % — ABNORMAL HIGH (ref 11.5–15.5)
WBC Count: 5.7 10*3/uL (ref 4.0–10.5)
nRBC: 0 % (ref 0.0–0.2)

## 2022-06-08 LAB — TSH: TSH: 2.437 u[IU]/mL (ref 0.350–4.500)

## 2022-06-08 LAB — IRON AND IRON BINDING CAPACITY (CC-WL,HP ONLY)
Iron: 226 ug/dL — ABNORMAL HIGH (ref 28–170)
Saturation Ratios: 59 % — ABNORMAL HIGH (ref 10.4–31.8)
TIBC: 382 ug/dL (ref 250–450)
UIBC: 156 ug/dL (ref 148–442)
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
## 2022-06-09 LAB — FERRITIN: Ferritin: 10 ng/mL — ABNORMAL LOW (ref 11–307)

## 2022-06-16 ENCOUNTER — Telehealth: Payer: Self-pay | Admitting: Hematology

## 2022-06-16 NOTE — Telephone Encounter (Signed)
Left message with follow-up appointments per 9/28 los.

## 2022-06-28 ENCOUNTER — Encounter: Payer: Self-pay | Admitting: Nurse Practitioner

## 2022-07-19 ENCOUNTER — Ambulatory Visit (HOSPITAL_BASED_OUTPATIENT_CLINIC_OR_DEPARTMENT_OTHER): Payer: Medicare Other | Admitting: Obstetrics & Gynecology

## 2022-07-19 ENCOUNTER — Encounter (HOSPITAL_BASED_OUTPATIENT_CLINIC_OR_DEPARTMENT_OTHER): Payer: Self-pay

## 2022-08-02 ENCOUNTER — Ambulatory Visit (HOSPITAL_BASED_OUTPATIENT_CLINIC_OR_DEPARTMENT_OTHER): Payer: Medicare Other | Admitting: Obstetrics & Gynecology

## 2022-08-08 ENCOUNTER — Inpatient Hospital Stay: Payer: Medicare Other | Attending: Oncology

## 2022-08-08 DIAGNOSIS — D508 Other iron deficiency anemias: Secondary | ICD-10-CM

## 2022-08-08 DIAGNOSIS — D509 Iron deficiency anemia, unspecified: Secondary | ICD-10-CM | POA: Diagnosis present

## 2022-08-08 LAB — CBC WITH DIFFERENTIAL (CANCER CENTER ONLY)
Abs Immature Granulocytes: 0.01 10*3/uL (ref 0.00–0.07)
Basophils Absolute: 0.1 10*3/uL (ref 0.0–0.1)
Basophils Relative: 1 %
Eosinophils Absolute: 0.1 10*3/uL (ref 0.0–0.5)
Eosinophils Relative: 1 %
HCT: 38.9 % (ref 36.0–46.0)
Hemoglobin: 12.9 g/dL (ref 12.0–15.0)
Immature Granulocytes: 0 %
Lymphocytes Relative: 33 %
Lymphs Abs: 2.1 10*3/uL (ref 0.7–4.0)
MCH: 29.1 pg (ref 26.0–34.0)
MCHC: 33.2 g/dL (ref 30.0–36.0)
MCV: 87.6 fL (ref 80.0–100.0)
Monocytes Absolute: 0.7 10*3/uL (ref 0.1–1.0)
Monocytes Relative: 11 %
Neutro Abs: 3.5 10*3/uL (ref 1.7–7.7)
Neutrophils Relative %: 54 %
Platelet Count: 288 10*3/uL (ref 150–400)
RBC: 4.44 MIL/uL (ref 3.87–5.11)
RDW: 16.5 % — ABNORMAL HIGH (ref 11.5–15.5)
WBC Count: 6.4 10*3/uL (ref 4.0–10.5)
nRBC: 0 % (ref 0.0–0.2)

## 2022-08-08 LAB — FERRITIN: Ferritin: 12 ng/mL (ref 11–307)

## 2022-08-08 LAB — IRON AND IRON BINDING CAPACITY (CC-WL,HP ONLY)
Iron: 145 ug/dL (ref 28–170)
Saturation Ratios: 32 % — ABNORMAL HIGH (ref 10.4–31.8)
TIBC: 454 ug/dL — ABNORMAL HIGH (ref 250–450)
UIBC: 309 ug/dL (ref 148–442)

## 2022-09-05 ENCOUNTER — Encounter: Payer: Self-pay | Admitting: Nurse Practitioner

## 2022-10-10 ENCOUNTER — Inpatient Hospital Stay: Payer: Medicare Other | Attending: Oncology

## 2022-10-10 DIAGNOSIS — D509 Iron deficiency anemia, unspecified: Secondary | ICD-10-CM | POA: Insufficient documentation

## 2022-10-10 DIAGNOSIS — D508 Other iron deficiency anemias: Secondary | ICD-10-CM

## 2022-10-10 LAB — CBC WITH DIFFERENTIAL (CANCER CENTER ONLY)
Abs Immature Granulocytes: 0.01 10*3/uL (ref 0.00–0.07)
Basophils Absolute: 0 10*3/uL (ref 0.0–0.1)
Basophils Relative: 1 %
Eosinophils Absolute: 0.1 10*3/uL (ref 0.0–0.5)
Eosinophils Relative: 1 %
HCT: 40.9 % (ref 36.0–46.0)
Hemoglobin: 13.9 g/dL (ref 12.0–15.0)
Immature Granulocytes: 0 %
Lymphocytes Relative: 28 %
Lymphs Abs: 1.8 10*3/uL (ref 0.7–4.0)
MCH: 30.2 pg (ref 26.0–34.0)
MCHC: 34 g/dL (ref 30.0–36.0)
MCV: 88.7 fL (ref 80.0–100.0)
Monocytes Absolute: 0.5 10*3/uL (ref 0.1–1.0)
Monocytes Relative: 8 %
Neutro Abs: 4.1 10*3/uL (ref 1.7–7.7)
Neutrophils Relative %: 62 %
Platelet Count: 270 10*3/uL (ref 150–400)
RBC: 4.61 MIL/uL (ref 3.87–5.11)
RDW: 14.3 % (ref 11.5–15.5)
WBC Count: 6.6 10*3/uL (ref 4.0–10.5)
nRBC: 0 % (ref 0.0–0.2)

## 2022-10-10 LAB — IRON AND IRON BINDING CAPACITY (CC-WL,HP ONLY)
Iron: 66 ug/dL (ref 28–170)
Saturation Ratios: 15 % (ref 10.4–31.8)
TIBC: 442 ug/dL (ref 250–450)
UIBC: 376 ug/dL (ref 148–442)

## 2022-10-10 LAB — FERRITIN: Ferritin: 15 ng/mL (ref 11–307)

## 2022-10-25 ENCOUNTER — Encounter (HOSPITAL_BASED_OUTPATIENT_CLINIC_OR_DEPARTMENT_OTHER): Payer: Self-pay | Admitting: Obstetrics & Gynecology

## 2022-10-26 ENCOUNTER — Other Ambulatory Visit (HOSPITAL_BASED_OUTPATIENT_CLINIC_OR_DEPARTMENT_OTHER): Payer: Self-pay | Admitting: Obstetrics & Gynecology

## 2022-10-26 MED ORDER — NONFORMULARY OR COMPOUNDED ITEM: 3 refills | Status: DC

## 2022-10-30 ENCOUNTER — Encounter: Payer: Self-pay | Admitting: *Deleted

## 2022-11-28 ENCOUNTER — Inpatient Hospital Stay: Payer: Medicare Other | Attending: Oncology

## 2022-11-28 DIAGNOSIS — Z90710 Acquired absence of both cervix and uterus: Secondary | ICD-10-CM | POA: Diagnosis not present

## 2022-11-28 DIAGNOSIS — D509 Iron deficiency anemia, unspecified: Secondary | ICD-10-CM | POA: Insufficient documentation

## 2022-11-28 DIAGNOSIS — K648 Other hemorrhoids: Secondary | ICD-10-CM | POA: Diagnosis not present

## 2022-11-28 DIAGNOSIS — D508 Other iron deficiency anemias: Secondary | ICD-10-CM

## 2022-11-28 LAB — CBC WITH DIFFERENTIAL (CANCER CENTER ONLY)
Abs Immature Granulocytes: 0.01 10*3/uL (ref 0.00–0.07)
Basophils Absolute: 0 10*3/uL (ref 0.0–0.1)
Basophils Relative: 1 %
Eosinophils Absolute: 0.1 10*3/uL (ref 0.0–0.5)
Eosinophils Relative: 1 %
HCT: 41.6 % (ref 36.0–46.0)
Hemoglobin: 13.8 g/dL (ref 12.0–15.0)
Immature Granulocytes: 0 %
Lymphocytes Relative: 25 %
Lymphs Abs: 1.4 10*3/uL (ref 0.7–4.0)
MCH: 30.3 pg (ref 26.0–34.0)
MCHC: 33.2 g/dL (ref 30.0–36.0)
MCV: 91.4 fL (ref 80.0–100.0)
Monocytes Absolute: 0.5 10*3/uL (ref 0.1–1.0)
Monocytes Relative: 9 %
Neutro Abs: 3.5 10*3/uL (ref 1.7–7.7)
Neutrophils Relative %: 64 %
Platelet Count: 265 10*3/uL (ref 150–400)
RBC: 4.55 MIL/uL (ref 3.87–5.11)
RDW: 14.5 % (ref 11.5–15.5)
WBC Count: 5.6 10*3/uL (ref 4.0–10.5)
nRBC: 0 % (ref 0.0–0.2)

## 2022-11-28 LAB — IRON AND IRON BINDING CAPACITY (CC-WL,HP ONLY)
Iron: 116 ug/dL (ref 28–170)
Saturation Ratios: 26 % (ref 10.4–31.8)
TIBC: 444 ug/dL (ref 250–450)
UIBC: 328 ug/dL (ref 148–442)

## 2022-11-28 LAB — FERRITIN: Ferritin: 13 ng/mL (ref 11–307)

## 2022-12-04 NOTE — Progress Notes (Unsigned)
Patient Care Team: Glenis Smoker, MD as PCP - General (Family Medicine)   CHIEF COMPLAINT: Follow up multifactorial anemia  CURRENT THERAPY: Oral iron daily 05/04/22 - 07/2022, now on women's multivitamin and observation   INTERVAL HISTORY Ms. Buckholz returns for follow up as scheduled. Last seen by me 06/08/22 for initial consult. She was on oral iron for 3 months.  She is doing well in the interim.  She is taking magnesium instead of Colace which has improved constipation and thus improved hemorrhoidal bleeding.  She did a "metabolic reset diet" in January which also helped bleeding and helped her feel better overall.  She developed spots on her hands and feet, biopsy suggested eczema and she is on betamethasone ointment per dermatology.     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 01/30/2022: Taking 2000IU/daily   hydrocortisone (ANUSOL-HC) 25 MG suppository Place 1 suppository (25 mg total) rectally 2 (two) times daily.    Lancets (ONETOUCH DELICA PLUS 123XX123) MISC Apply 1 each topically daily.    latanoprost (XALATAN) 0.005 % ophthalmic solution Place 1 drop into both eyes at bedtime.  08/21/2013: Received from: External Pharmacy   magnesium (MAGTAB) 84 MG (7MEQ) TBCR SR  tablet Take 168 mg by mouth.    metFORMIN (GLUCOPHAGE-XR) 500 MG 24 hr tablet Take 500 mg by mouth daily with breakfast.    NONFORMULARY OR COMPOUNDED ITEM Vitamin E vaginal suppositories 200u/ml.  One pv three times weekly.    ONE TOUCH ULTRA TEST test strip  08/21/2013: Received from: External Pharmacy   Polyethylene Glycol 3350 (MIRALAX PO) Take by mouth.    pravastatin (PRAVACHOL) 20 MG tablet Take 20 mg by mouth See admin instructions. Takes 5 day/per week 01/30/2022: Patient is currently on 20mg  and is taking it 5 days a week    Probiotic Product (PROBIOTIC DAILY PO) Take by mouth.    valACYclovir (VALTREX) 1000 MG tablet Take 2 tablets (2gms) orally and repeat in 12 hours. 01/30/2022: As needed    docusate sodium (COLACE) 100 MG capsule Take 100 mg by mouth 2 (two) times daily. (Patient not taking: Reported on 12/05/2022)    Ferrous Sulfate (IRON) 325 (65 Fe) MG TABS Take 1 tablet by mouth daily. (Patient not taking: Reported on 12/05/2022)    Lidocaine, Anorectal, 5 % CREA Apply topically. (Patient not taking: Reported on 06/08/2022)    No facility-administered encounter medications on file as of 12/05/2022.     Today's Vitals   12/05/22 0846  BP: 116/62  Pulse: 84  Resp: 16  Temp: 98.4 F (36.9 C)  TempSrc: Oral  SpO2: 98%  Weight: 201 lb 1.6 oz (91.2 kg)  Height: 5'  3.5" (1.613 m)   Body mass index is 35.06 kg/m.   PHYSICAL EXAM GENERAL:alert, no distress and comfortable SKIN: no rash  EYES: sclera clear LUNGS: clear with normal breathing effort HEART: regular rate & rhythm, no lower extremity edema NEURO: alert & oriented x 3 with fluent speech, no focal motor deficits   CBC    Component Value Date/Time   WBC 5.6 11/28/2022 1043   WBC 5.9 02/02/2010 0915   RBC 4.55 11/28/2022 1043   HGB 13.8 11/28/2022 1043   HGB 12.9 08/21/2013 1008   HCT 41.6 11/28/2022 1043   PLT 265 11/28/2022 1043   MCV 91.4 11/28/2022 1043   MCH 30.3 11/28/2022 1043   MCHC 33.2  11/28/2022 1043   RDW 14.5 11/28/2022 1043   LYMPHSABS 1.4 11/28/2022 1043   MONOABS 0.5 11/28/2022 1043   EOSABS 0.1 11/28/2022 1043   BASOSABS 0.0 11/28/2022 1043     CMP     Component Value Date/Time   NA 142 02/02/2010 0915   K 3.8 02/02/2010 0915   CL 105 02/02/2010 0915   CO2 29 02/02/2010 0915   GLUCOSE 95 02/02/2010 0915   BUN 8 02/02/2010 0915   CREATININE 0.67 02/02/2010 0915   CALCIUM 9.2 02/02/2010 0915   PROT 7.1 02/02/2010 0915   ALBUMIN 4.2 02/02/2010 0915   AST 20 02/02/2010 0915   ALT 22 02/02/2010 0915   ALKPHOS 86 02/02/2010 0915   BILITOT 0.5 02/02/2010 0915   GFRNONAA >60 02/02/2010 0915   GFRAA  02/02/2010 0915    >60        The eGFR has been calculated using the MDRD equation. This calculation has not been validated in all clinical situations. eGFR's persistently <60 mL/min signify possible Chronic Kidney Disease.     ASSESSMENT & PLAN: 67 yo female with    1.Iron deficiency anemia, secondary to blood donation, hemorrhoids -She was found to have isolated anemia in 12/2008 during admission for hysterectomy.  -She was found to have mild anemia in 09/2021 after blood donation, which recovered in time to donate in 12/2021. Has been donating through [facility] q3-4 months x4 years.  -We recommended to hold blood donation x1 year, then ok to resume if not anemic, and take oral iron after donation   -Last colonoscopy Collene Mares) 2021 showed small internal hemorrhoids. F/up GI -Suspicion for primary bone marrow disorder was low, given no other cytopenias -Her IDA was likely secondary to blood donation, possibly poor iron absorption, and hemorrhoidal bleeding - Lab 04/21/22 showed microcytic anemia with hgb 10.1, low serum iron 25, and ferritin 10 (on 06/08/22).  -She took oral iron 325 mg po once daily from 05/04/22 - 07/2022, she tolerated well with constipation and dark stools -Subsequent labs showed IDA resolved, although ferritin still in low-normal range. She did  not require IV iron -I recommend to continue women's multivitamin with Fe -Continue CBC with iron/TIBC, and Ferritin q6-12 months  -She can f/up with PCP in the future. We will certainly see her back if she has recurrent IDA, need for IV iron, or other cytopenias in the future. Pt agrees.   2. Rectal bleeding -Secondary to hemorrhoids as seen on last colonoscopy in 2021, otherwise negative. -She changed colace to magnesium which is more effective, and "metabolic reset diet" -Continue supportive regimen and f/up GI, Dr. Collene Mares      PLAN: -Labs reviewed -Continue multivitamin with Fe -Continue holding blood donation for now, OK to resume if next labs are normal. Limit to  1-2 x per year -Take oral iron 1 month prior, and 2 months post donation -F/up PCP with CBC, Iron/TIBC, and ferritin q6-12 months  -F/up open, if needed in the future      All questions were answered. The patient knows to call the clinic with any problems, questions or concerns. No barriers to learning were detected.   Cira Rue, NP-C 12/05/2022

## 2022-12-05 ENCOUNTER — Other Ambulatory Visit: Payer: Self-pay

## 2022-12-05 ENCOUNTER — Telehealth: Payer: Self-pay

## 2022-12-05 ENCOUNTER — Encounter: Payer: Self-pay | Admitting: Nurse Practitioner

## 2022-12-05 ENCOUNTER — Inpatient Hospital Stay (HOSPITAL_BASED_OUTPATIENT_CLINIC_OR_DEPARTMENT_OTHER): Payer: Medicare Other | Admitting: Nurse Practitioner

## 2022-12-05 ENCOUNTER — Other Ambulatory Visit: Payer: Medicare Other

## 2022-12-05 VITALS — BP 116/62 | HR 84 | Temp 98.4°F | Resp 16 | Ht 63.5 in | Wt 201.1 lb

## 2022-12-05 DIAGNOSIS — D508 Other iron deficiency anemias: Secondary | ICD-10-CM

## 2022-12-05 DIAGNOSIS — D509 Iron deficiency anemia, unspecified: Secondary | ICD-10-CM | POA: Diagnosis not present

## 2022-12-05 NOTE — Telephone Encounter (Signed)
Faxed over the last office visit notes as per Cira Rue NP, received fax confirmation.

## 2023-03-05 ENCOUNTER — Other Ambulatory Visit (HOSPITAL_BASED_OUTPATIENT_CLINIC_OR_DEPARTMENT_OTHER): Payer: Self-pay | Admitting: Family Medicine

## 2023-03-05 DIAGNOSIS — Z1231 Encounter for screening mammogram for malignant neoplasm of breast: Secondary | ICD-10-CM

## 2023-04-07 ENCOUNTER — Ambulatory Visit (HOSPITAL_BASED_OUTPATIENT_CLINIC_OR_DEPARTMENT_OTHER)
Admission: RE | Admit: 2023-04-07 | Discharge: 2023-04-07 | Disposition: A | Discharge status: Home / Self Care | Payer: Medicare Other | Source: Ambulatory Visit | Attending: Family Medicine | Admitting: Family Medicine

## 2023-04-07 DIAGNOSIS — Z1231 Encounter for screening mammogram for malignant neoplasm of breast: Secondary | ICD-10-CM | POA: Diagnosis present

## 2023-04-23 ENCOUNTER — Other Ambulatory Visit: Payer: Self-pay | Admitting: Family Medicine

## 2023-04-23 DIAGNOSIS — E2839 Other primary ovarian failure: Secondary | ICD-10-CM

## 2023-11-01 ENCOUNTER — Other Ambulatory Visit: Payer: Self-pay | Admitting: Family Medicine

## 2023-11-01 DIAGNOSIS — E2839 Other primary ovarian failure: Secondary | ICD-10-CM

## 2023-11-02 ENCOUNTER — Other Ambulatory Visit: Payer: Medicare Other

## 2024-04-03 ENCOUNTER — Other Ambulatory Visit (HOSPITAL_BASED_OUTPATIENT_CLINIC_OR_DEPARTMENT_OTHER): Payer: Self-pay | Admitting: Family Medicine

## 2024-04-03 DIAGNOSIS — Z1231 Encounter for screening mammogram for malignant neoplasm of breast: Secondary | ICD-10-CM

## 2024-05-09 ENCOUNTER — Encounter (HOSPITAL_BASED_OUTPATIENT_CLINIC_OR_DEPARTMENT_OTHER): Payer: Self-pay | Admitting: Radiology

## 2024-05-09 ENCOUNTER — Ambulatory Visit (HOSPITAL_BASED_OUTPATIENT_CLINIC_OR_DEPARTMENT_OTHER)
Admission: RE | Admit: 2024-05-09 | Discharge: 2024-05-09 | Disposition: A | Discharge status: Home / Self Care | Source: Ambulatory Visit | Attending: Family Medicine | Admitting: Family Medicine

## 2024-05-09 DIAGNOSIS — Z1231 Encounter for screening mammogram for malignant neoplasm of breast: Secondary | ICD-10-CM | POA: Insufficient documentation

## 2024-06-25 ENCOUNTER — Other Ambulatory Visit: Payer: Medicare Other

## 2024-07-15 ENCOUNTER — Other Ambulatory Visit (HOSPITAL_BASED_OUTPATIENT_CLINIC_OR_DEPARTMENT_OTHER): Payer: Self-pay | Admitting: Obstetrics & Gynecology

## 2024-07-15 MED ORDER — NONFORMULARY OR COMPOUNDED ITEM: 3 refills | Status: AC

## 2024-08-18 ENCOUNTER — Ambulatory Visit (HOSPITAL_BASED_OUTPATIENT_CLINIC_OR_DEPARTMENT_OTHER)
Admission: RE | Admit: 2024-08-18 | Discharge: 2024-08-18 | Disposition: A | Source: Ambulatory Visit | Attending: Family Medicine

## 2024-08-18 DIAGNOSIS — E2839 Other primary ovarian failure: Secondary | ICD-10-CM

## 2024-08-27 ENCOUNTER — Encounter: Payer: Self-pay | Admitting: Podiatry

## 2024-08-27 ENCOUNTER — Ambulatory Visit: Payer: Self-pay | Admitting: Podiatry

## 2024-08-27 DIAGNOSIS — E1142 Type 2 diabetes mellitus with diabetic polyneuropathy: Secondary | ICD-10-CM

## 2024-08-27 DIAGNOSIS — L84 Corns and callosities: Secondary | ICD-10-CM

## 2024-08-27 NOTE — Progress Notes (Signed)
°  Subjective:  Patient ID: Carolyn Rollins, female    DOB: [birth year],   MRN: [id number]  No chief complaint on file.   68 y.o. female presents for concern of lesion in between her left fourth and fifth toes.  He has tried padding as well as wart remover's and salicylic acid pads to help with this.  It helped some but she still getting a lot of pain in the area.  Denies burning and tingling in their feet. Patient is diabetic and last A1c was No results found for: HGBA1C .   PCP:  Chrystal Lamarr RAMAN, MD    . Denies any other pedal complaints. Denies n/v/f/c.   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 CFT <3 seconds.  Absent hair growth on digits. No edema.  Skin. No lacerations or abrasions bilateral feet.  Hyperkeratotic lesion noted to lateral fourth digit on the left. Musculoskeletal: MMT 5/5 bilateral lower extremities in DF, PF, Inversion and Eversion. Deceased ROM in DF of ankle joint.  Adductovarus noted of the fifth digit on the left. Neurological: Sensation intact to light touch.  Protective sensation diminished in a couple places bilateral.  Assessment:   1. Corns and callosities   2. Type 2 diabetes mellitus with peripheral neuropathy ([facility]CC)      Plan:  Patient was evaluated and treated and all questions answered. -Discussed and educated patient on diabetic foot care, especially with  regards to the vascular, neurological and musculoskeletal systems.  -Stressed the importance of good glycemic control and the detriment of not  controlling glucose levels in relation to the foot. -Discussed supportive shoes at all times and checking feet regularly.  -Mechanically debrided all nails 1-5 bilateral using  sterile nail nipper and filed with dremel without incident  -Answered all patient questions Discussed if continued to have problems with this toe there is surgical options.   -Patient to return  in 3 months for at risk foot care -Patient advised to call the office if any problems or questions arise in the meantime.   Asberry Failing, DPM

## 2024-11-26 ENCOUNTER — Ambulatory Visit: Admitting: Podiatry
